# Patient Record
Sex: Female | Born: 1990 | Race: White | Hispanic: No | Smoking: Never smoker
Health system: Southern US, Community
[De-identification: ages and names within clinical notes are randomized; demographics above are authoritative.]

## PROBLEM LIST (undated history)

## (undated) DIAGNOSIS — Z789 Other specified health status: Secondary | ICD-10-CM

## (undated) DIAGNOSIS — K219 Gastro-esophageal reflux disease without esophagitis: Secondary | ICD-10-CM

## (undated) DIAGNOSIS — K529 Noninfective gastroenteritis and colitis, unspecified: Secondary | ICD-10-CM

## (undated) HISTORY — DX: Gastro-esophageal reflux disease without esophagitis: K21.9

---
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
## 2011-01-21 NOTE — Op Note (Signed)
[place]  Patient:    Nancy [name], Nancy [name] Visit Number: [id number] MRN: [id number]          Service Type: DSU Location: [facility] Attending Physician:  Aundria Mems Dictated by:   Kathy Breach, M.D. Proc. Date: 11/05/01 Admit Date:  11/05/2001                             Operative Report  PREOPERATIVE DIAGNOSIS: 1. Recurrent otitis media with effusion and conductive hearing loss, left ear. 2. Hyperplastic necrotic adenoiditis.  POSTOPERATIVE DIAGNOSIS: 1. Recurrent otitis media with effusion and conductive hearing loss, left ear. 2. Hyperplastic necrotic adenoiditis.  OPERATION PERFORMED:  Myringotomy with insertion of T-tube, left ear, examination of right ear under anesthesia, and adenoidectomy.  SURGEON:  Kathy Breach, M.D.  ANESTHESIA:  DESCRIPTION OF PROCEDURE:  Under visualization with the operating microscope, the left tympanic membrane was inspected, there was no attic retraction present.  Tympanic membrane was opaque in color with moderate tympanosclerosis involving the anterior half of the tympanic membrane and posterior superiorly. A radial anterior inferior myringotomy incision was made and a somewhat clouded thick mucoid fluid aspirated from the middle ear space.  A T-tube was inserted.  Floxin Otic drops were displaced demonstrating retrograde patent eustachian tube.  The right ear was then inspected under visualization with the operating microscope.  There was a small tympanosclerotic plaque in the posterior inferior quadrant but otherwise completely normal-appearing tympanic membrane.  A Crowe-Davis mouth gag was inserted and the patient put in a Rose position. The oral cavity examination revealed normal-appearing soft palate and intact hard palate to palpation.  The tonsils were within the fossa to 1+ enlarged bilaterally.  A red rubber catheter was passed through the left nasal chamber and used to elevate the soft palate.   Mirror visualization of the nasopharynx revealed the nasopharynx extensive adenoid tissue with heavy cloudy mucoid discharge.  Adenoids removed by curettage and under mirror visualization with suction cautery.  Complete ablation of remaining adenoid tissue especially that extending into posterior choanae and rosenmuller fossa bilaterally and obtaining complete hemostasis of the adenoidectomy site was completed.  Blood loss suction canister estimated about 30 cc.  The patient tolerated the procedure well and was taken to the recovery room in stable general condition. Dictated by:   Kathy Breach, M.D. Attending Physician:  Aundria Mems DD:  11/05/01 TD:  11/05/01 Job: [id number]

## 2011-03-01 ENCOUNTER — Inpatient Hospital Stay (HOSPITAL_COMMUNITY)
Admission: AD | Admit: 2011-03-01 | Discharge: 2011-03-05 | DRG: 766 | Disposition: A | Discharge status: Home / Self Care | Payer: Medicaid Other | Source: Ambulatory Visit | Attending: Obstetrics and Gynecology | Admitting: Obstetrics and Gynecology

## 2011-03-01 DIAGNOSIS — O324XX0 Maternal care for high head at term, not applicable or unspecified: Secondary | ICD-10-CM | POA: Diagnosis present

## 2011-03-01 DIAGNOSIS — O4290 Premature rupture of membranes, unspecified as to length of time between rupture and onset of labor, unspecified weeks of gestation: Secondary | ICD-10-CM | POA: Diagnosis present

## 2011-03-01 LAB — COMPREHENSIVE METABOLIC PANEL
Albumin: 2.8 g/dL — ABNORMAL LOW (ref 3.5–5.2)
BUN: 7 mg/dL (ref 6–23)
Calcium: 10.4 mg/dL (ref 8.4–10.5)
GFR calc Af Amer: >60 mL/min (ref >60)
Glucose, Bld: 81 mg/dL (ref 70–99)
Total Protein: 6.3 g/dL (ref 6.0–8.3)

## 2011-03-01 LAB — CBC
HCT: 29 % — ABNORMAL LOW (ref 36.0–46.0)
Hemoglobin: 9.6 g/dL — ABNORMAL LOW (ref 12.0–15.0)
MCH: 27 pg (ref 26.0–34.0)
MCHC: 33.1 g/dL (ref 30.0–36.0)
RDW: 12.6 % (ref 11.5–15.5)

## 2011-03-04 LAB — CBC
HCT: 21.8 % — ABNORMAL LOW (ref 36.0–46.0)
HCT: 22.8 % — ABNORMAL LOW (ref 36.0–46.0)
Hemoglobin: 7.2 g/dL — ABNORMAL LOW (ref 12.0–15.0)
Hemoglobin: 7.4 g/dL — ABNORMAL LOW (ref 12.0–15.0)
MCH: 26.8 pg (ref 26.0–34.0)
MCHC: 32.5 g/dL (ref 30.0–36.0)
MCV: 81 fL (ref 78.0–100.0)
MCV: 82.3 fL (ref 78.0–100.0)
RBC: 2.69 MIL/uL — ABNORMAL LOW (ref 3.87–5.11)
RDW: 13.1 % (ref 11.5–15.5)

## 2011-03-06 ENCOUNTER — Inpatient Hospital Stay (HOSPITAL_COMMUNITY): Admission: AD | Admit: 2011-03-06 | Payer: Self-pay | Source: Home / Self Care | Admitting: Obstetrics and Gynecology

## 2011-03-21 NOTE — Discharge Summary (Signed)
[name], Nancy [name] NO.:  [id number]  MEDICAL RECORD NO.:  [id number]  LOCATION:  9102                          FACILITY:  [facility]  PHYSICIAN:  Zelphia Cairo, MD    DATE OF BIRTH:  February 22, 1991  DATE OF ADMISSION:  03/01/2011 DATE OF DISCHARGE:  03/05/2011                              DISCHARGE SUMMARY   ADMITTING DIAGNOSES: 1. Intrauterine pregnancy with rupture of membranes. 2. Spontaneous onset of labor.  DISCHARGE DIAGNOSES: 1. Status post low transverse cesarean section secondary to failure to     progress. 2. Viable female infant.  PROCEDURE:  Primary low transverse cesarean section.  REASON FOR ADMISSION:  Please see written H&P.  HOSPITAL COURSE:  The patient is an 19-year primigravida who was admitted to [facility] at 22 weeks' estimated gestational age with spontaneous rupture of membranes and early onset of labor.  On admission vital signs were stable.  Fetal heart tones in the 140s-150s with nice acceleration, contraction approximately every 3-5 minutes.  Cervix was examined and found to be 3 cm dilated, 70% effaced, vertex at -1 station.  The patient did progress to full dilatation after pushing for approximately 2 hours with no further descent and the fetal vertex decision was made to proceed with a primary low transverse cesarean section.  The patient was then transferred to operating room where epidural was dosed to an adequate surgical level.  A low transverse incision was made with delivery of a viable female infant weighing 7 pounds 0 ounces with Apgar's of 8 at 1 minute and 9 at 5 minutes.  The patient tolerated the procedure well and taken to the recovery room in stable condition.  On postoperative day #1, the patient was without complaint.  Vital signs were stable.  Abdomen is soft. Fundus firm and nontender.  Laboratory findings showed hemoglobin of 7.2.  On postoperative day #2, the patient was without complaint.   Vital signs remained stable.  Abdomen soft.  Fundus firm and nontender. Incision was clean, dry, and intact.  Repeat CBC revealed hemoglobin stable at 7.4.  On postoperative day #3, the patient was ready for discharge.  Vital signs were stable.  Fundus firm and nontender. Incision was clean, dry, and intact.  Discharge instructions were reviewed and the patient was later discharged home.  CONDITION ON DISCHARGE:  Stable.  DIET:  Regular as tolerated.  ACTIVITY:  No heavy lifting, no driving x2 weeks, no vaginal entry.  FOLLOWUP:  The patient to follow up in the office in 1-2 weeks for an incision check.  She is to call for temperature greater than 100 degrees, persistent nausea, vomiting, heavy vaginal bleeding and/or redness or drainage from incisional site.  DISCHARGE MEDICATIONS: 1. Tylox #30, 1 p.o. every 4-6 hours. 2. Motrin 600 mg every 6 hours. 3. Prenatal vitamins 1 p.o. daily. 4. Colace 1 p.o. daily p.r.n.     Julio Sicks, N.P.   ______________________________ Zelphia Cairo, MD    CC/MEDQ  D:  03/21/2011  T:  03/21/2011  Job:  [id number]

## 2011-03-28 NOTE — Op Note (Signed)
NAMEMarland [name]  NAVPREET, SZCZYGIEL NO.:  [id number]  MEDICAL RECORD NO.:  [id number]  LOCATION:  9102                          FACILITY:  [facility]  PHYSICIAN:  Juluis Mire, M.D.   DATE OF BIRTH:  1991-02-22  DATE OF PROCEDURE:  03/02/2011 DATE OF DISCHARGE:                              OPERATIVE REPORT   PREOPERATIVE DIAGNOSIS:  Intrauterine pregnancy at term, failure to descend.  POSTOPERATIVE DIAGNOSIS:  Intrauterine pregnancy at term, failure to descend.  OPERATIVE PROCEDURE:  Low transverse cesarean section.  SURGEON:  Juluis Mire, MD  ANESTHESIA:  Epidural.  ESTIMATED BLOOD LOSS:  600 mL.  PACKS AND DRAINS:  None.  INTRAOPERATIVE BLOOD PLACED:  None.  COMPLICATIONS:  None.  INDICATIONS:  The patient is an 20 year old primigravida female, presents spontaneous onset of labor.  With the aid of Pitocin, progressed the complete dilatation with 2 hours of pushing.  Fetal vertex made absolutely at arrested at -1 station without descending increasing caput.  The decision was made to do a primary cesarean section.  It was noted that she did have a very narrow pelvic outlet. The risk of cesarean section were explained including the risk of infection.  The risk of hemorrhage could require transfusion with the risk of AIDS or hepatitis.  Excessive bleeding could require hysterectomy.  Risk of injury to adjacent organs including bladder, bowel, or ureters that could require further exploratory surgery.  Risk of deep venous thrombosis and pulmonary embolus.  The patient does understand indications and risks.  PROCEDURES:  The patient was taken to the OR, placed in supine position with left lateral tilt.  After satisfactory level of epidural anesthesia was obtained, the abdomen was prepped out with Betadine and draped in sterile field.  Low transverse skin incision made knife, carried through subcutaneous tissue.  She did have some discomfort on the left side  of incision.  This was injected have some Marcaine.  Incision fashioned laterally.  Fascia was taken off the muscle superiorly and inferiorly. Rectus muscles were separated midline.  Peritoneum was entered sharply and incision of peritoneum extended both superiorly inferiorly.  The low- transverse bladder flap was developed, a low-transverse uterine incision was begun with knife extending lateral using manual traction.  Infant presented in the vertex presentation.  We did have some difficulty delivering the vertex.  We had to split the muscles on each side slightly, then the vertex, delivered with elevation of the head and fundal pressure. The infant was a viable female.  Apgars were 8/9.  Weight and pH are pending.  Placenta was delivered manually and was unremarkable.  Uterus exteriorized for closure.  Tubes and ovaries were unremarkable.  Uterus was then closed with running locking suture of 0 chromic using a two-layer closure technique.  Uterus was returned to the abdominal cavity.  Urine was slightly blood tinged; when we came into the OR, it was clearing in the tube.  We irrigated the pelvis, had no active bleeding and again clear urine output.  Muscles and peritoneum were closed with running suture of 3-0 Vicryl.  Fascia was closed with a running suture of 0 PDS.  Skin was closed staples and Steri-Strips. Sponge, instrument and needle count was correct  by circulating nurse x2. The patient tolerated the procedure well, was returned to recovery room in good condition.     Juluis Mire, M.D.     JSM/MEDQ  D:  03/02/2011  T:  03/03/2011  Job:  [id number]  Electronically Signed by Richardean Chimera M.D. on 03/28/2011 06:10:25 AM

## 2011-06-03 LAB — POCT CARDIAC MARKERS: CKMB, poc: <1 — ABNORMAL LOW

## 2013-02-09 ENCOUNTER — Encounter (HOSPITAL_COMMUNITY): Payer: Self-pay | Admitting: Emergency Medicine

## 2013-02-09 ENCOUNTER — Emergency Department (HOSPITAL_COMMUNITY)
Admission: EM | Admit: 2013-02-09 | Discharge: 2013-02-09 | Disposition: A | Discharge status: Home / Self Care | Payer: Self-pay | Attending: Emergency Medicine | Admitting: Emergency Medicine

## 2013-02-09 ENCOUNTER — Emergency Department (HOSPITAL_COMMUNITY): Payer: Self-pay

## 2013-02-09 DIAGNOSIS — R109 Unspecified abdominal pain: Secondary | ICD-10-CM | POA: Insufficient documentation

## 2013-02-09 DIAGNOSIS — R112 Nausea with vomiting, unspecified: Secondary | ICD-10-CM | POA: Insufficient documentation

## 2013-02-09 DIAGNOSIS — N898 Other specified noninflammatory disorders of vagina: Secondary | ICD-10-CM | POA: Insufficient documentation

## 2013-02-09 DIAGNOSIS — Z3202 Encounter for pregnancy test, result negative: Secondary | ICD-10-CM | POA: Insufficient documentation

## 2013-02-09 DIAGNOSIS — R350 Frequency of micturition: Secondary | ICD-10-CM | POA: Insufficient documentation

## 2013-02-09 DIAGNOSIS — N12 Tubulo-interstitial nephritis, not specified as acute or chronic: Secondary | ICD-10-CM | POA: Insufficient documentation

## 2013-02-09 DIAGNOSIS — R000 Tachycardia, unspecified: Secondary | ICD-10-CM | POA: Insufficient documentation

## 2013-02-09 DIAGNOSIS — R300 Dysuria: Secondary | ICD-10-CM | POA: Insufficient documentation

## 2013-02-09 LAB — CBC
HCT: 33.9 % — ABNORMAL LOW (ref 36.0–46.0)
Hemoglobin: 11.5 g/dL — ABNORMAL LOW (ref 12.0–15.0)
MCH: 29.2 pg (ref 26.0–34.0)
MCHC: 33.9 g/dL (ref 30.0–36.0)
MCV: 86 fL (ref 78.0–100.0)
Platelets: 149 10*3/uL — ABNORMAL LOW (ref 150–400)
RBC: 3.94 MIL/uL (ref 3.87–5.11)
RDW: 12.2 % (ref 11.5–15.5)
WBC: 15.2 10*3/uL — ABNORMAL HIGH (ref 4.0–10.5)

## 2013-02-09 LAB — URINALYSIS, ROUTINE W REFLEX MICROSCOPIC
Glucose, UA: NEGATIVE mg/dL
Specific Gravity, Urine: 1.017 (ref 1.005–1.030)
Urobilinogen, UA: 1 mg/dL (ref 0.0–1.0)

## 2013-02-09 LAB — BASIC METABOLIC PANEL
BUN: 8 mg/dL (ref 6–23)
CO2: 24 mEq/L (ref 19–32)
Calcium: 8.9 mg/dL (ref 8.4–10.5)
Chloride: 103 mEq/L (ref 96–112)
Creatinine, Ser: 0.84 mg/dL (ref 0.50–1.10)
GFR calc Af Amer: >90 mL/min (ref >90)
GFR calc non Af Amer: >90 mL/min (ref >90)
Glucose, Bld: 110 mg/dL — ABNORMAL HIGH (ref 70–99)
Potassium: 3.6 mEq/L (ref 3.5–5.1)
Sodium: 137 mEq/L (ref 135–145)

## 2013-02-09 LAB — WET PREP, GENITAL
Clue Cells Wet Prep HPF POC: NONE SEEN
Trich, Wet Prep: NONE SEEN
Yeast Wet Prep HPF POC: NONE SEEN

## 2013-02-09 LAB — URINE MICROSCOPIC-ADD ON

## 2013-02-09 LAB — PREGNANCY, URINE: Preg Test, Ur: NEGATIVE

## 2013-02-09 MED ORDER — ONDANSETRON HCL 4 MG/2ML IJ SOLN
4.0000 mg | Freq: Once | INTRAMUSCULAR | Status: AC
  Administered 2013-02-09: 4 mg via INTRAVENOUS
  Filled 2013-02-09: qty 2

## 2013-02-09 MED ORDER — MORPHINE SULFATE 4 MG/ML IJ SOLN
4.0000 mg | Freq: Once | INTRAMUSCULAR | Status: AC
  Administered 2013-02-09: 4 mg via INTRAVENOUS
  Filled 2013-02-09: qty 1

## 2013-02-09 MED ORDER — IOHEXOL 300 MG/ML  SOLN
80.0000 mL | Freq: Once | INTRAMUSCULAR | Status: AC | PRN
  Administered 2013-02-09: 80 mL via INTRAVENOUS

## 2013-02-09 MED ORDER — CIPROFLOXACIN HCL 500 MG PO TABS: 500.0000 mg | ORAL_TABLET | Freq: Two times a day (BID) | ORAL | Status: DC

## 2013-02-09 MED ORDER — SODIUM CHLORIDE 0.9 % IV BOLUS (SEPSIS)
1000.0000 mL | Freq: Once | INTRAVENOUS | Status: AC
  Administered 2013-02-09: 1000 mL via INTRAVENOUS

## 2013-02-09 MED ORDER — ONDANSETRON 4 MG PO TBDP: 4.0000 mg | ORAL_TABLET | Freq: Three times a day (TID) | ORAL | Status: DC | PRN

## 2013-02-09 MED ORDER — ACETAMINOPHEN 500 MG PO TABS
1000.0000 mg | ORAL_TABLET | Freq: Once | ORAL | Status: AC
  Administered 2013-02-09: 1000 mg via ORAL
  Filled 2013-02-09: qty 2

## 2013-02-09 MED ORDER — IOHEXOL 300 MG/ML  SOLN
50.0000 mL | Freq: Once | INTRAMUSCULAR | Status: AC | PRN
  Administered 2013-02-09: 50 mL via ORAL

## 2013-02-09 MED ORDER — HYDROCODONE-ACETAMINOPHEN 5-325 MG PO TABS: ORAL_TABLET | ORAL | Status: DC

## 2013-02-09 MED ORDER — DICYCLOMINE HCL 10 MG PO CAPS: 10.0000 mg | ORAL_CAPSULE | Freq: Once | ORAL | Status: DC

## 2013-02-09 MED ORDER — DEXTROSE 5 % IV SOLN
1.0000 g | INTRAVENOUS | Status: DC
  Administered 2013-02-09: 1 g via INTRAVENOUS
  Filled 2013-02-09: qty 10

## 2013-02-09 NOTE — ED Notes (Signed)
Patient in CT at this time.

## 2013-02-09 NOTE — ED Provider Notes (Signed)
History     CSN: [id number]  Arrival date & time 02/09/13  4098   First MD Initiated Contact with Patient 02/09/13 1115      Chief Complaint  Patient presents with  . Back Pain    (Consider location/radiation/quality/duration/timing/severity/associated sxs/prior treatment) HPI Pt is a 22yo female c/o acute onset of lower abdominal pain that is sharp and cramping, radiating to lower back, 10/10 that started last night.  Associated with nausea and 3 episodes of NBNB emesis this morning.  Pt also c/o mild dysuria and vaginal discharged that is white/yellow in color w/o blood.  States LMP was 02/07/13.  Pt has tried ibuprofen for pain with minimal relief.  Denies fever diarrhea or constipation.  Reports c-section 2 years ago w/o complication.  Denies recent travel or sick contacts.   History reviewed. No pertinent past medical history.  History reviewed. No pertinent past surgical history.  History reviewed. No pertinent family history.  History  Substance Use Topics  . Smoking status: Not on file  . Smokeless tobacco: Not on file  . Alcohol Use: Not on file    OB History   Grav Para Term Preterm Abortions TAB SAB Ect Mult Living                  Review of Systems  Constitutional: Negative for fever and chills.  Respiratory: Negative for shortness of breath.   Cardiovascular: Negative for chest pain.  Gastrointestinal: Positive for nausea and vomiting. Negative for diarrhea, constipation and blood in stool.  Genitourinary: Positive for dysuria, frequency and vaginal discharge. Negative for hematuria, flank pain and vaginal pain.  All other systems reviewed and are negative.    Allergies  Review of patient's allergies indicates no known allergies.  Home Medications   Current Outpatient Rx  Name  Route  Sig  Dispense  Refill  . ibuprofen (ADVIL,MOTRIN) 200 MG tablet   Oral   Take 600 mg by mouth once.           BP 112/61  Pulse 105  Temp(Src) 100 F (37.8 C)  (Oral)  Resp 16  Ht 5\' 1"  (1.549 m)  Wt 115 lb (52.164 kg)  BMI 21.74 kg/m2  SpO2 100%  LMP 02/07/2013  Physical Exam  Nursing note and vitals reviewed. Constitutional: She appears well-developed and well-nourished. No distress.  Pt lying on exam bed, appears in mild discomfort  HENT:  Head: Normocephalic and atraumatic.  Eyes: Conjunctivae are normal. No scleral icterus.  Neck: Normal range of motion.  Cardiovascular: Regular rhythm and normal heart sounds.  Tachycardia present.   Pulmonary/Chest: Effort normal and breath sounds normal. No respiratory distress. She has no wheezes. She has no rales. She exhibits no tenderness.  Abdominal: Soft. Bowel sounds are normal. She exhibits no distension and no mass. There is tenderness. There is CVA tenderness ( L>R). There is no rebound and no guarding. Hernia confirmed negative in the right inguinal area and confirmed negative in the left inguinal area.  Genitourinary: Uterus normal. No labial fusion. There is no rash, tenderness, lesion or injury on the right labia. There is no rash, tenderness, lesion or injury on the left labia. Cervix exhibits discharge ( yellow clear discharge and blood). Cervix exhibits no motion tenderness and no friability. Right adnexum displays no mass, no tenderness and no fullness. Left adnexum displays no mass, no tenderness and no fullness. There is bleeding ( moderate amount) around the vagina. No erythema or tenderness around the vagina. No foreign body  around the vagina. No signs of injury around the vagina. Vaginal discharge ( scant yellow discharge) found.  Musculoskeletal: Normal range of motion. She exhibits tenderness ( across lower lumbar spine).  Lymphadenopathy:       Right: No inguinal adenopathy present.       Left: No inguinal adenopathy present.  Neurological: She is alert.  Skin: Skin is warm and dry. She is not diaphoretic.    ED Course  Procedures (including critical care time)  Labs Reviewed   WET PREP, GENITAL - Abnormal; Notable for the following:    WBC, Wet Prep HPF POC FEW (*)    All other components within normal limits  URINALYSIS, ROUTINE W REFLEX MICROSCOPIC - Abnormal; Notable for the following:    APPearance CLOUDY (*)    Hgb urine dipstick LARGE (*)    Protein, ur 30 (*)    Leukocytes, UA SMALL (*)    All other components within normal limits  CBC - Abnormal; Notable for the following:    WBC 15.2 (*)    Hemoglobin 11.5 (*)    HCT 33.9 (*)    Platelets 149 (*)    All other components within normal limits  BASIC METABOLIC PANEL - Abnormal; Notable for the following:    Glucose, Bld 110 (*)    All other components within normal limits  GC/CHLAMYDIA PROBE AMP  URINE CULTURE  PREGNANCY, URINE  URINE MICROSCOPIC-ADD ON   Ct Abdomen Pelvis W Contrast  02/09/2013   *RADIOLOGY REPORT*  Clinical Data: Lower abdominal and back pain.  Nausea and vomiting.  CT ABDOMEN AND PELVIS WITH CONTRAST  Technique:  Multidetector CT imaging of the abdomen and pelvis was performed following the standard protocol during bolus administration of intravenous contrast.  Contrast: 80mL OMNIPAQUE IOHEXOL 300 MG/ML  SOLN  Comparison: No priors.  Findings:  Lung Bases: Unremarkable.  Abdomen/Pelvis:  The appearance of the liver, gallbladder, pancreas, spleen, bilateral adrenal glands tip in the right kidney is unremarkable.  There are some ill-defined areas of decreased perfusion in the left kidney, suspicious for early changes related to pyelonephritis.  A small volume of free fluid the cul-de-sac is presumably physiologic in this young female patient.  No larger volume of ascites.  No pneumoperitoneum.  No pathologic distension of small bowel.  Normal appendix.  No definite pathologic lymphadenopathy identified within the abdomen or pelvis.  A tampon is present within the vagina.  Uterus and ovaries are unremarkable in appearance.  Urinary bladder is normal in appearance.  Musculoskeletal: There are  no aggressive appearing lytic or blastic lesions noted in the visualized portions of the skeleton.  IMPRESSION: 1.  Areas of decreased perfusion in the left kidney highly suspicious for early changes related to pyelonephritis.  Clinical correlation is recommended. 2.  Small volume of free fluid the cul-de-sac is presumably physiologic in this young female patient. 3.  Normal appendix. 4.  Additional incidental findings, as above.   Original Report Authenticated By: Trudie Reed, M.D.     1. Pyelonephritis   2. Nausea & vomiting       MDM  Concern for UTI/pyelonephrosis, ectopic, vaginal infection.  Pt is tachycardic with pain 10/10.  Will obtain basic labs and urine. Started pt on fluids and morphine for pain with zofran  CBC: WBC-15.2 BMP: unremarkable  UA: mild leukocytosis, RBC 7-10, bacteria rare.  Urine not very impressive to suggest pylonephrosis.  Will culture urine Urine preg: neg  Due to tachycardia, pain, and unimpressive UA, will obtain  CT abd.  CT abd: shows areas of decreased perfusion in left kidney, highly suspicious for early changes related to pyelonephritis.  Although pt's UA was not very impressive, ordered 1g Rocephin to start tx for pyelo. Based on pt's symptoms and CT.  Pt is still tachycardic, gave more morphine and fluids.  If vitals improve, will discharge pt home with out patient treatment, otherwise, consider admitting pt for IV tx and observation.  6:12 PMPt developed low grade temp at 100.  Vitals have improved, but still at little tachycardic at 105, pressure 112/61.  Will give pt acetaminophine and another bag of fluid.  Discussed option of being admitted for pain control or discharged home for outpatient tx.  Pt states she feels comfortable going home.  Will discharge pt home after fluids.  Rx: ciprofloxacin, norco and zofran.  Will have pt f/u with PCP, urgent care, or return to ER if symptoms not improving/worsening.  Pt verbalized understanding and  agreement with tx plan. Vitals: unremarkable. Discharged home in stable condition.    Discussed pt with attending during ED encounter.                Junius Finner, PA-C 02/09/13 1856

## 2013-02-09 NOTE — ED Notes (Signed)
Waiting for fluid bolus to finish and to recheck heart rate once done.

## 2013-02-09 NOTE — ED Notes (Signed)
Patient presents to ED today with complaints of pain with urination low back pain, low abdominal pain since last night.

## 2013-02-10 LAB — GC/CHLAMYDIA PROBE AMP
CT Probe RNA: NEGATIVE
GC Probe RNA: NEGATIVE

## 2013-02-11 NOTE — ED Provider Notes (Signed)
Medical screening examination/treatment/procedure(s) were conducted as a shared visit with non-physician practitioner(s) and myself.  I personally evaluated the patient during the encounter Pt c/o lower abd discomfort for past day, esp left. No dysuria. 7-10 wbc. Persistent tenderness. Rocephin iv. Cx. Persistent pain, ct.   Suzi Roots, MD 02/11/13 [phone]

## 2013-02-12 LAB — URINE CULTURE: Colony Count: 40000

## 2013-02-13 NOTE — ED Notes (Signed)
Post ED Visit - Positive Culture Follow-up  Culture report reviewed by antimicrobial stewardship pharmacist: [x]  Wes Dulaney, Pharm.D., BCPS []  Celedonio Miyamoto, Pharm.D., BCPS []  Georgina Pillion, Pharm.D., BCPS []  [place], [place].D., BCPS, AAHIVP []  Estella Husk, Pharm.D., BCPS, AAHIVP  Positive urine culture  no further patient follow-up is required at this time.  Larena Sox 02/13/2013, 3:38 PM

## 2013-04-29 ENCOUNTER — Emergency Department (HOSPITAL_COMMUNITY): Payer: Medicaid Other

## 2013-04-29 ENCOUNTER — Emergency Department (HOSPITAL_COMMUNITY)
Admission: EM | Admit: 2013-04-29 | Discharge: 2013-04-29 | Disposition: A | Discharge status: Home / Self Care | Payer: Medicaid Other | Attending: Emergency Medicine | Admitting: Emergency Medicine

## 2013-04-29 ENCOUNTER — Encounter (HOSPITAL_COMMUNITY): Payer: Self-pay | Admitting: Emergency Medicine

## 2013-04-29 DIAGNOSIS — O30049 Twin pregnancy, dichorionic/diamniotic, unspecified trimester: Secondary | ICD-10-CM

## 2013-04-29 DIAGNOSIS — R109 Unspecified abdominal pain: Secondary | ICD-10-CM | POA: Insufficient documentation

## 2013-04-29 DIAGNOSIS — O9989 Other specified diseases and conditions complicating pregnancy, childbirth and the puerperium: Secondary | ICD-10-CM | POA: Insufficient documentation

## 2013-04-29 LAB — POCT I-STAT, CHEM 8
BUN: 5 mg/dL — ABNORMAL LOW (ref 6–23)
Creatinine, Ser: 0.7 mg/dL (ref 0.50–1.10)
Hemoglobin: 12.2 g/dL (ref 12.0–15.0)
Potassium: 3.6 mEq/L (ref 3.5–5.1)
Sodium: 139 mEq/L (ref 135–145)

## 2013-04-29 LAB — CBC WITH DIFFERENTIAL/PLATELET
Basophils Absolute: 0 10*3/uL (ref 0.0–0.1)
Eosinophils Absolute: 0.1 10*3/uL (ref 0.0–0.7)
Eosinophils Relative: 1 % (ref 0–5)
MCH: 29.6 pg (ref 26.0–34.0)
MCHC: 34.6 g/dL (ref 30.0–36.0)
MCV: 85.6 fL (ref 78.0–100.0)
Monocytes Absolute: 0.8 10*3/uL (ref 0.1–1.0)
Platelets: 167 10*3/uL (ref 150–400)
RDW: 12.4 % (ref 11.5–15.5)

## 2013-04-29 LAB — HCG, QUANTITATIVE, PREGNANCY: hCG, Beta Chain, Quant, S: 38750 m[IU]/mL — ABNORMAL HIGH (ref <5)

## 2013-04-29 MED ORDER — SODIUM CHLORIDE 0.9 % IV BOLUS (SEPSIS)
1000.0000 mL | Freq: Once | INTRAVENOUS | Status: AC
  Administered 2013-04-29: 1000 mL via INTRAVENOUS

## 2013-04-29 NOTE — Progress Notes (Signed)
[facility] provided patient with a list of primary care resources, highlighting the [facility].

## 2013-04-29 NOTE — ED Provider Notes (Signed)
  This was a shared visit with a mid-level provided (NP or PA).  Throughout the patient's course I was available for consultation/collaboration.  I saw the relevant labs and studies - I agree with the interpretation.  On my exam the patient was in no distress.  She was resting comfortably, with a child on her gurney.  With her elevated hCG, pain, bleeding, no prior evaluations pregnancy, ultrasound was ordered.  This was pending on sign out      Gerhard Munch, MD 04/29/13 [phone]

## 2013-04-29 NOTE — ED Notes (Signed)
Patient transported to Ultrasound 

## 2013-04-29 NOTE — ED Notes (Signed)
Pt arrived to ED via POV with a complaint of vaginal bleeding.  Pt is [redacted] weeks pregnant. Pt states she had spotting yesterday that progressed to a large amount in the toilet.  Pt is wearing pads and states she has used one over the last several hours.

## 2013-04-29 NOTE — ED Provider Notes (Signed)
CSN: [id number]     Arrival date & time 04/29/13  0403 History     First MD Initiated Contact with Patient 04/29/13 [phone]     Chief Complaint  Patient presents with  . Vaginal Bleeding   (Consider location/radiation/quality/duration/timing/severity/associated sxs/prior Treatment) HPI Comments: Patient has had a confirmed pregnancy at the health department.  She is 10, and a half weeks pregnant, yesterday.  She noticed some intermittent spotting x2.  This morning.  She woke up to use the restroom and noticed some bright red blood on the tissue while wiping.  She has some abdominal pressure, but no discrete pain.  This is a desired pregnancy.  Patient is a 22 y.o. female presenting with vaginal bleeding. The history is provided by the patient.  Vaginal Bleeding Quality:  Lighter than menses and spotting Severity:  Mild Onset quality:  Sudden Duration:  2 days Timing:  Intermittent Chronicity:  New Number of pads used:  1 Number of tampons used:  0 Possible pregnancy: yes   Relieved by:  None tried Associated symptoms: abdominal pain   Associated symptoms: no back pain, no dysuria, no fever, no nausea and no vaginal discharge   Risk factors: no hx of ectopic pregnancy, no hx of endometriosis, no gynecological surgery, no prior miscarriage, no STD and no terminated pregnancies     History reviewed. No pertinent past medical history. Past Surgical History  Procedure Laterality Date  . Cesarean section     No family history on file. History  Substance Use Topics  . Smoking status: Never Smoker   . Smokeless tobacco: Not on file  . Alcohol Use: No   OB History   Grav Para Term Preterm Abortions TAB SAB Ect Mult Living   1              Review of Systems  Constitutional: Negative for fever and chills.  Gastrointestinal: Positive for abdominal pain. Negative for nausea and vomiting.  Genitourinary: Positive for vaginal bleeding. Negative for dysuria, frequency, vaginal  discharge and vaginal pain.  Musculoskeletal: Negative for back pain.  All other systems reviewed and are negative.    Allergies  Review of patient's allergies indicates no known allergies.  Home Medications   Current Outpatient Rx  Name  Route  Sig  Dispense  Refill  . ibuprofen (ADVIL,MOTRIN) 200 MG tablet   Oral   Take 600 mg by mouth once.          BP 108/54  Pulse 63  Temp(Src) 98.4 F (36.9 C) (Oral)  Resp 16  Ht 5\' 1"  (1.549 m)  Wt 115 lb (52.164 kg)  BMI 21.74 kg/m2  SpO2 99%  LMP 02/07/2013 Physical Exam  Nursing note and vitals reviewed. Constitutional: She is oriented to person, place, and time. She appears well-developed and well-nourished.  HENT:  Head: Normocephalic.  Eyes: Pupils are equal, round, and reactive to light.  Cardiovascular: Normal rate and regular rhythm.   Pulmonary/Chest: Effort normal and breath sounds normal.  Abdominal: Soft. There is no tenderness.  Genitourinary: Vagina normal. Cervix exhibits no discharge. Right adnexum displays no tenderness. Left adnexum displays no tenderness. No vaginal discharge found.  Cervix is closed.  There is old brownish blood within the vaginal vault  Neurological: She is alert and oriented to person, place, and time.  Skin: Skin is warm and dry.    ED Course   Procedures (including critical care time)  Labs Reviewed  WET PREP, GENITAL  GC/CHLAMYDIA PROBE AMP  CBC WITH  DIFFERENTIAL  HCG, QUANTITATIVE, PREGNANCY   No results found. No diagnosis found.  MDM     Arman Filter, NP 04/29/13 [phone]

## 2013-04-29 NOTE — Progress Notes (Signed)
[facility] ED CM spoke with pt during ED visit for 04/29/13 and pt confirmed no pcp [facility] ED CM spoke with pt on how to obtain an in network pcp with insurance coverage via the customer service number or web site Pt states ob gyn is [facility] updated   CM encouraged pt and discussed pt's responsibility to verify with pt's insurance carrier that any recommended medical provider offered by any emergency room or a hospital provider is within the carrier's network. The pt voiced understanding

## 2013-04-30 LAB — GC/CHLAMYDIA PROBE AMP: GC Probe RNA: NEGATIVE

## 2013-09-30 ENCOUNTER — Other Ambulatory Visit: Payer: Self-pay

## 2013-10-14 ENCOUNTER — Other Ambulatory Visit: Payer: Self-pay

## 2013-10-15 ENCOUNTER — Other Ambulatory Visit (HOSPITAL_COMMUNITY): Payer: Self-pay | Admitting: Obstetrics and Gynecology

## 2013-10-15 DIAGNOSIS — O30009 Twin pregnancy, unspecified number of placenta and unspecified number of amniotic sacs, unspecified trimester: Secondary | ICD-10-CM

## 2013-10-24 ENCOUNTER — Encounter (HOSPITAL_COMMUNITY): Payer: Self-pay

## 2013-10-24 ENCOUNTER — Ambulatory Visit (HOSPITAL_COMMUNITY)
Admission: RE | Admit: 2013-10-24 | Discharge: 2013-10-24 | Disposition: A | Discharge status: Home / Self Care | Payer: Medicaid Other | Source: Ambulatory Visit | Attending: Obstetrics and Gynecology | Admitting: Obstetrics and Gynecology

## 2013-10-24 DIAGNOSIS — O34219 Maternal care for unspecified type scar from previous cesarean delivery: Secondary | ICD-10-CM | POA: Diagnosis not present

## 2013-10-24 DIAGNOSIS — Z1389 Encounter for screening for other disorder: Secondary | ICD-10-CM | POA: Diagnosis not present

## 2013-10-24 DIAGNOSIS — Z363 Encounter for antenatal screening for malformations: Secondary | ICD-10-CM | POA: Insufficient documentation

## 2013-10-24 DIAGNOSIS — O30009 Twin pregnancy, unspecified number of placenta and unspecified number of amniotic sacs, unspecified trimester: Secondary | ICD-10-CM | POA: Insufficient documentation

## 2013-10-24 DIAGNOSIS — O358XX0 Maternal care for other (suspected) fetal abnormality and damage, not applicable or unspecified: Secondary | ICD-10-CM | POA: Diagnosis not present

## 2013-10-28 ENCOUNTER — Ambulatory Visit (INDEPENDENT_AMBULATORY_CARE_PROVIDER_SITE_OTHER): Payer: Medicaid Other | Admitting: *Deleted

## 2013-10-28 VITALS — BP 122/74

## 2013-10-28 DIAGNOSIS — O30009 Twin pregnancy, unspecified number of placenta and unspecified number of amniotic sacs, unspecified trimester: Secondary | ICD-10-CM

## 2013-10-28 NOTE — Progress Notes (Signed)
P = 79   Copy of report and NST tracing sent to Dr. Henderson Cloudomblin w/pt today

## 2013-11-05 ENCOUNTER — Ambulatory Visit (INDEPENDENT_AMBULATORY_CARE_PROVIDER_SITE_OTHER): Payer: Medicaid Other | Admitting: *Deleted

## 2013-11-05 VITALS — BP 127/79

## 2013-11-05 DIAGNOSIS — O30009 Twin pregnancy, unspecified number of placenta and unspecified number of amniotic sacs, unspecified trimester: Secondary | ICD-10-CM

## 2013-11-05 NOTE — Progress Notes (Signed)
P = 94  Copy of report and NST tracing sent to Dr. Henderson Cloudomblin w/pt today.

## 2013-11-07 ENCOUNTER — Other Ambulatory Visit (HOSPITAL_COMMUNITY): Payer: Self-pay | Admitting: Obstetrics and Gynecology

## 2013-11-07 DIAGNOSIS — IMO0001 Reserved for inherently not codable concepts without codable children: Secondary | ICD-10-CM

## 2013-11-11 ENCOUNTER — Encounter (HOSPITAL_COMMUNITY): Payer: Self-pay | Admitting: *Deleted

## 2013-11-11 ENCOUNTER — Other Ambulatory Visit (HOSPITAL_COMMUNITY): Payer: Self-pay | Admitting: Obstetrics and Gynecology

## 2013-11-11 ENCOUNTER — Inpatient Hospital Stay (HOSPITAL_COMMUNITY)
Admission: AD | Admit: 2013-11-11 | Discharge: 2013-11-15 | DRG: 765 | Disposition: A | Discharge status: Home / Self Care | Payer: Medicaid Other | Source: Ambulatory Visit | Attending: Obstetrics & Gynecology | Admitting: Obstetrics & Gynecology

## 2013-11-11 ENCOUNTER — Ambulatory Visit (HOSPITAL_COMMUNITY)
Admission: RE | Admit: 2013-11-11 | Discharge: 2013-11-11 | Disposition: A | Discharge status: Home / Self Care | Payer: Medicaid Other | Source: Ambulatory Visit | Attending: Obstetrics and Gynecology | Admitting: Obstetrics and Gynecology

## 2013-11-11 ENCOUNTER — Inpatient Hospital Stay (HOSPITAL_COMMUNITY): Payer: Medicaid Other | Admitting: Anesthesiology

## 2013-11-11 ENCOUNTER — Encounter (HOSPITAL_COMMUNITY)
Admission: AD | Disposition: A | Discharge status: Home / Self Care | Payer: Self-pay | Source: Ambulatory Visit | Attending: Obstetrics & Gynecology

## 2013-11-11 ENCOUNTER — Encounter (HOSPITAL_COMMUNITY): Payer: Medicaid Other | Admitting: Anesthesiology

## 2013-11-11 DIAGNOSIS — IMO0001 Reserved for inherently not codable concepts without codable children: Secondary | ICD-10-CM

## 2013-11-11 DIAGNOSIS — O30049 Twin pregnancy, dichorionic/diamniotic, unspecified trimester: Secondary | ICD-10-CM

## 2013-11-11 DIAGNOSIS — O365990 Maternal care for other known or suspected poor fetal growth, unspecified trimester, not applicable or unspecified: Secondary | ICD-10-CM | POA: Diagnosis present

## 2013-11-11 DIAGNOSIS — Z302 Encounter for sterilization: Secondary | ICD-10-CM

## 2013-11-11 DIAGNOSIS — O9902 Anemia complicating childbirth: Secondary | ICD-10-CM | POA: Diagnosis present

## 2013-11-11 DIAGNOSIS — O34219 Maternal care for unspecified type scar from previous cesarean delivery: Principal | ICD-10-CM | POA: Diagnosis present

## 2013-11-11 DIAGNOSIS — Z3490 Encounter for supervision of normal pregnancy, unspecified, unspecified trimester: Secondary | ICD-10-CM

## 2013-11-11 DIAGNOSIS — O30009 Twin pregnancy, unspecified number of placenta and unspecified number of amniotic sacs, unspecified trimester: Secondary | ICD-10-CM | POA: Diagnosis present

## 2013-11-11 DIAGNOSIS — O360990 Maternal care for other rhesus isoimmunization, unspecified trimester, not applicable or unspecified: Secondary | ICD-10-CM | POA: Diagnosis present

## 2013-11-11 DIAGNOSIS — D649 Anemia, unspecified: Secondary | ICD-10-CM | POA: Diagnosis present

## 2013-11-11 HISTORY — DX: Other specified health status: Z78.9

## 2013-11-11 LAB — CBC
HEMATOCRIT: 30.3 % — AB (ref 36.0–46.0)
HEMOGLOBIN: 10.1 g/dL — AB (ref 12.0–15.0)
MCH: 27.1 pg (ref 26.0–34.0)
MCHC: 33.3 g/dL (ref 30.0–36.0)
MCV: 81.2 fL (ref 78.0–100.0)
PLATELETS: 145 10*3/uL — AB (ref 150–400)
RBC: 3.73 MIL/uL — AB (ref 3.87–5.11)
RDW: 13.1 % (ref 11.5–15.5)
WBC: 9.1 10*3/uL (ref 4.0–10.5)

## 2013-11-11 LAB — RPR: RPR Ser Ql: NONREACTIVE

## 2013-11-11 SURGERY — Surgical Case
Anesthesia: Spinal | Site: Abdomen

## 2013-11-11 MED ORDER — MORPHINE SULFATE 0.5 MG/ML IJ SOLN
INTRAMUSCULAR | Status: AC
  Filled 2013-11-11: qty 10

## 2013-11-11 MED ORDER — FENTANYL CITRATE 0.05 MG/ML IJ SOLN
INTRAMUSCULAR | Status: AC
  Filled 2013-11-11: qty 2

## 2013-11-11 MED ORDER — LACTATED RINGERS IV SOLN: INTRAVENOUS | Status: DC

## 2013-11-11 MED ORDER — IBUPROFEN 600 MG PO TABS
600.0000 mg | ORAL_TABLET | Freq: Four times a day (QID) | ORAL | Status: DC
  Administered 2013-11-11 – 2013-11-15 (×15): 600 mg via ORAL
  Filled 2013-11-11 (×15): qty 1

## 2013-11-11 MED ORDER — PROMETHAZINE HCL 25 MG/ML IJ SOLN: 6.2500 mg | INTRAMUSCULAR | Status: DC | PRN

## 2013-11-11 MED ORDER — LACTATED RINGERS IV BOLUS (SEPSIS)
1000.0000 mL | Freq: Once | INTRAVENOUS | Status: AC
  Administered 2013-11-11: 1000 mL via INTRAVENOUS

## 2013-11-11 MED ORDER — OXYTOCIN 40 UNITS IN LACTATED RINGERS INFUSION - SIMPLE MED: 62.5000 mL/h | INTRAVENOUS | Status: AC

## 2013-11-11 MED ORDER — PHENYLEPHRINE 8 MG IN D5W 100 ML (0.08MG/ML) PREMIX OPTIME
INJECTION | INTRAVENOUS | Status: AC
  Filled 2013-11-11: qty 100

## 2013-11-11 MED ORDER — NALBUPHINE HCL 10 MG/ML IJ SOLN: 5.0000 mg | INTRAMUSCULAR | Status: DC | PRN

## 2013-11-11 MED ORDER — MORPHINE SULFATE (PF) 0.5 MG/ML IJ SOLN
INTRAMUSCULAR | Status: DC | PRN
  Administered 2013-11-11: .1 mg via INTRATHECAL

## 2013-11-11 MED ORDER — FENTANYL CITRATE 0.05 MG/ML IJ SOLN
INTRAMUSCULAR | Status: DC | PRN
  Administered 2013-11-11: 15 ug via INTRATHECAL
  Administered 2013-11-11: 35 ug via INTRAVENOUS

## 2013-11-11 MED ORDER — 0.9 % SODIUM CHLORIDE (POUR BTL) OPTIME
TOPICAL | Status: DC | PRN
  Administered 2013-11-11: 1000 mL

## 2013-11-11 MED ORDER — MENTHOL 3 MG MT LOZG: 1.0000 | LOZENGE | OROMUCOSAL | Status: DC | PRN

## 2013-11-11 MED ORDER — ONDANSETRON HCL 4 MG/2ML IJ SOLN: 4.0000 mg | INTRAMUSCULAR | Status: DC | PRN

## 2013-11-11 MED ORDER — LANOLIN HYDROUS EX OINT: 1.0000 "application " | TOPICAL_OINTMENT | CUTANEOUS | Status: DC | PRN

## 2013-11-11 MED ORDER — DIPHENHYDRAMINE HCL 50 MG/ML IJ SOLN: 25.0000 mg | INTRAMUSCULAR | Status: DC | PRN

## 2013-11-11 MED ORDER — DIPHENHYDRAMINE HCL 50 MG/ML IJ SOLN: 12.5000 mg | INTRAMUSCULAR | Status: DC | PRN

## 2013-11-11 MED ORDER — NALOXONE HCL 0.4 MG/ML IJ SOLN: 0.4000 mg | INTRAMUSCULAR | Status: DC | PRN

## 2013-11-11 MED ORDER — PHENYLEPHRINE 40 MCG/ML (10ML) SYRINGE FOR IV PUSH (FOR BLOOD PRESSURE SUPPORT)
PREFILLED_SYRINGE | INTRAVENOUS | Status: AC
  Filled 2013-11-11: qty 5

## 2013-11-11 MED ORDER — OXYTOCIN 40 UNITS IN LACTATED RINGERS INFUSION - SIMPLE MED
INTRAVENOUS | Status: DC | PRN
  Administered 2013-11-11: 40 [IU] via INTRAVENOUS

## 2013-11-11 MED ORDER — BUPIVACAINE IN DEXTROSE 0.75-8.25 % IT SOLN
INTRATHECAL | Status: DC | PRN
  Administered 2013-11-11: 1.3 mL via INTRATHECAL

## 2013-11-11 MED ORDER — CITRIC ACID-SODIUM CITRATE 334-500 MG/5ML PO SOLN
30.0000 mL | Freq: Once | ORAL | Status: AC
  Administered 2013-11-11: 30 mL via ORAL
  Filled 2013-11-11: qty 15

## 2013-11-11 MED ORDER — MEPERIDINE HCL 25 MG/ML IJ SOLN: 6.2500 mg | INTRAMUSCULAR | Status: DC | PRN

## 2013-11-11 MED ORDER — ONDANSETRON HCL 4 MG/2ML IJ SOLN
INTRAMUSCULAR | Status: AC
  Filled 2013-11-11: qty 2

## 2013-11-11 MED ORDER — MEPERIDINE HCL 25 MG/ML IJ SOLN
6.2500 mg | INTRAMUSCULAR | Status: DC | PRN
  Administered 2013-11-11: 6.25 mg via INTRAVENOUS

## 2013-11-11 MED ORDER — KETOROLAC TROMETHAMINE 30 MG/ML IJ SOLN: 30.0000 mg | Freq: Four times a day (QID) | INTRAMUSCULAR | Status: AC | PRN

## 2013-11-11 MED ORDER — OXYTOCIN 10 UNIT/ML IJ SOLN
INTRAMUSCULAR | Status: AC
  Filled 2013-11-11: qty 4

## 2013-11-11 MED ORDER — SCOPOLAMINE 1 MG/3DAYS TD PT72
MEDICATED_PATCH | TRANSDERMAL | Status: AC
  Administered 2013-11-11: 1.5 mg via TRANSDERMAL
  Filled 2013-11-11: qty 1

## 2013-11-11 MED ORDER — ZOLPIDEM TARTRATE 5 MG PO TABS: 5.0000 mg | ORAL_TABLET | Freq: Every evening | ORAL | Status: DC | PRN

## 2013-11-11 MED ORDER — MEPERIDINE HCL 25 MG/ML IJ SOLN
INTRAMUSCULAR | Status: AC
  Filled 2013-11-11: qty 1

## 2013-11-11 MED ORDER — FENTANYL CITRATE 0.05 MG/ML IJ SOLN: 25.0000 ug | INTRAMUSCULAR | Status: DC | PRN

## 2013-11-11 MED ORDER — SIMETHICONE 80 MG PO CHEW
80.0000 mg | CHEWABLE_TABLET | Freq: Three times a day (TID) | ORAL | Status: DC
  Administered 2013-11-11 – 2013-11-15 (×11): 80 mg via ORAL
  Filled 2013-11-11 (×11): qty 1

## 2013-11-11 MED ORDER — METOCLOPRAMIDE HCL 5 MG/ML IJ SOLN: 10.0000 mg | Freq: Three times a day (TID) | INTRAMUSCULAR | Status: DC | PRN

## 2013-11-11 MED ORDER — SIMETHICONE 80 MG PO CHEW: 80.0000 mg | CHEWABLE_TABLET | ORAL | Status: DC | PRN

## 2013-11-11 MED ORDER — ACETAMINOPHEN 500 MG PO TABS
1000.0000 mg | ORAL_TABLET | Freq: Four times a day (QID) | ORAL | Status: AC
  Administered 2013-11-11: 1000 mg via ORAL
  Filled 2013-11-11: qty 2

## 2013-11-11 MED ORDER — PRENATAL MULTIVITAMIN CH
1.0000 | ORAL_TABLET | Freq: Every day | ORAL | Status: DC
  Administered 2013-11-12 – 2013-11-15 (×4): 1 via ORAL
  Filled 2013-11-11 (×4): qty 1

## 2013-11-11 MED ORDER — KETOROLAC TROMETHAMINE 30 MG/ML IJ SOLN
30.0000 mg | Freq: Four times a day (QID) | INTRAMUSCULAR | Status: AC | PRN
  Administered 2013-11-11: 30 mg via INTRAMUSCULAR
  Filled 2013-11-11: qty 1

## 2013-11-11 MED ORDER — ONDANSETRON HCL 4 MG PO TABS: 4.0000 mg | ORAL_TABLET | ORAL | Status: DC | PRN

## 2013-11-11 MED ORDER — PHENYLEPHRINE HCL 10 MG/ML IJ SOLN
20.0000 mg | INTRAVENOUS | Status: DC | PRN
  Administered 2013-11-11: 60 ug/min via INTRAVENOUS

## 2013-11-11 MED ORDER — CEFAZOLIN SODIUM-DEXTROSE 2-3 GM-% IV SOLR
INTRAVENOUS | Status: DC | PRN
  Administered 2013-11-11: 2 g via INTRAVENOUS

## 2013-11-11 MED ORDER — LACTATED RINGERS IV SOLN
INTRAVENOUS | Status: DC | PRN
  Administered 2013-11-11: 14:00:00 via INTRAVENOUS

## 2013-11-11 MED ORDER — ONDANSETRON HCL 4 MG/2ML IJ SOLN: 4.0000 mg | Freq: Three times a day (TID) | INTRAMUSCULAR | Status: DC | PRN

## 2013-11-11 MED ORDER — SODIUM CHLORIDE 0.9 % IJ SOLN: 3.0000 mL | INTRAMUSCULAR | Status: DC | PRN

## 2013-11-11 MED ORDER — SENNOSIDES-DOCUSATE SODIUM 8.6-50 MG PO TABS
2.0000 | ORAL_TABLET | ORAL | Status: DC
  Administered 2013-11-11 – 2013-11-14 (×4): 2 via ORAL
  Filled 2013-11-11 (×4): qty 2

## 2013-11-11 MED ORDER — MIDAZOLAM HCL 2 MG/2ML IJ SOLN: 0.5000 mg | Freq: Once | INTRAMUSCULAR | Status: DC | PRN

## 2013-11-11 MED ORDER — WITCH HAZEL-GLYCERIN EX PADS: 1.0000 "application " | MEDICATED_PAD | CUTANEOUS | Status: DC | PRN

## 2013-11-11 MED ORDER — FAMOTIDINE IN NACL 20-0.9 MG/50ML-% IV SOLN
20.0000 mg | Freq: Once | INTRAVENOUS | Status: AC
  Administered 2013-11-11: 20 mg via INTRAVENOUS
  Filled 2013-11-11: qty 50

## 2013-11-11 MED ORDER — DIPHENHYDRAMINE HCL 25 MG PO CAPS: 25.0000 mg | ORAL_CAPSULE | Freq: Four times a day (QID) | ORAL | Status: DC | PRN

## 2013-11-11 MED ORDER — LACTATED RINGERS IV SOLN
INTRAVENOUS | Status: DC
  Administered 2013-11-11 (×2): via INTRAVENOUS

## 2013-11-11 MED ORDER — NALBUPHINE HCL 10 MG/ML IJ SOLN
5.0000 mg | INTRAMUSCULAR | Status: DC | PRN
  Administered 2013-11-11: 10 mg via SUBCUTANEOUS

## 2013-11-11 MED ORDER — DIPHENHYDRAMINE HCL 25 MG PO CAPS
25.0000 mg | ORAL_CAPSULE | ORAL | Status: DC | PRN
  Administered 2013-11-11 – 2013-11-12 (×2): 25 mg via ORAL
  Filled 2013-11-11 (×3): qty 1

## 2013-11-11 MED ORDER — IBUPROFEN 600 MG PO TABS: 600.0000 mg | ORAL_TABLET | Freq: Four times a day (QID) | ORAL | Status: DC | PRN

## 2013-11-11 MED ORDER — SIMETHICONE 80 MG PO CHEW
80.0000 mg | CHEWABLE_TABLET | ORAL | Status: DC
  Administered 2013-11-11 – 2013-11-14 (×4): 80 mg via ORAL
  Filled 2013-11-11 (×5): qty 1

## 2013-11-11 MED ORDER — TETANUS-DIPHTH-ACELL PERTUSSIS 5-2.5-18.5 LF-MCG/0.5 IM SUSP: 0.5000 mL | Freq: Once | INTRAMUSCULAR | Status: DC

## 2013-11-11 MED ORDER — NALOXONE HCL 1 MG/ML IJ SOLN
1.0000 ug/kg/h | INTRAMUSCULAR | Status: DC | PRN
  Filled 2013-11-11: qty 2

## 2013-11-11 MED ORDER — SCOPOLAMINE 1 MG/3DAYS TD PT72
1.0000 | MEDICATED_PATCH | Freq: Once | TRANSDERMAL | Status: AC
  Administered 2013-11-11: 1.5 mg via TRANSDERMAL

## 2013-11-11 MED ORDER — NALBUPHINE HCL 10 MG/ML IJ SOLN
INTRAMUSCULAR | Status: AC
  Filled 2013-11-11: qty 1

## 2013-11-11 MED ORDER — DIBUCAINE 1 % RE OINT: 1.0000 "application " | TOPICAL_OINTMENT | RECTAL | Status: DC | PRN

## 2013-11-11 MED ORDER — OXYCODONE-ACETAMINOPHEN 5-325 MG PO TABS
1.0000 | ORAL_TABLET | ORAL | Status: DC | PRN
  Administered 2013-11-12 (×2): 1 via ORAL
  Administered 2013-11-12 – 2013-11-14 (×3): 2 via ORAL
  Filled 2013-11-11 (×2): qty 1
  Filled 2013-11-11 (×3): qty 2

## 2013-11-11 SURGICAL SUPPLY — 35 items
BENZOIN TINCTURE PRP APPL 2/3 (GAUZE/BANDAGES/DRESSINGS) ×2 IMPLANT
CLAMP CORD UMBIL (MISCELLANEOUS) ×2 IMPLANT
CLOTH BEACON ORANGE TIMEOUT ST (SAFETY) ×2 IMPLANT
DERMABOND ADVANCED (GAUZE/BANDAGES/DRESSINGS)
DERMABOND ADVANCED .7 DNX12 (GAUZE/BANDAGES/DRESSINGS) IMPLANT
DRAPE LG THREE QUARTER DISP (DRAPES) ×4 IMPLANT
DRSG OPSITE POSTOP 4X10 (GAUZE/BANDAGES/DRESSINGS) ×2 IMPLANT
DURAPREP 26ML APPLICATOR (WOUND CARE) ×2 IMPLANT
ELECT REM PT RETURN 9FT ADLT (ELECTROSURGICAL) ×2
ELECTRODE REM PT RTRN 9FT ADLT (ELECTROSURGICAL) ×1 IMPLANT
EXTRACTOR VACUUM KIWI (MISCELLANEOUS) ×2 IMPLANT
EXTRACTOR VACUUM M CUP 4 TUBE (SUCTIONS) IMPLANT
GLOVE BIO SURGEON STRL SZ 6 (GLOVE) ×2 IMPLANT
GLOVE BIO SURGEON STRL SZ 6.5 (GLOVE) ×2 IMPLANT
GLOVE BIOGEL PI IND STRL 6 (GLOVE) ×2 IMPLANT
GLOVE BIOGEL PI IND STRL 7.0 (GLOVE) ×1 IMPLANT
GLOVE BIOGEL PI INDICATOR 6 (GLOVE) ×2
GLOVE BIOGEL PI INDICATOR 7.0 (GLOVE) ×1
GOWN STRL REUS W/TWL LRG LVL3 (GOWN DISPOSABLE) ×4 IMPLANT
KIT ABG SYR 3ML LUER SLIP (SYRINGE) ×2 IMPLANT
NEEDLE HYPO 25X5/8 SAFETYGLIDE (NEEDLE) ×2 IMPLANT
NS IRRIG 1000ML POUR BTL (IV SOLUTION) ×2 IMPLANT
PACK C SECTION WH (CUSTOM PROCEDURE TRAY) ×2 IMPLANT
PAD OB MATERNITY 4.3X12.25 (PERSONAL CARE ITEMS) ×2 IMPLANT
STAPLER VISISTAT 35W (STAPLE) IMPLANT
STRIP CLOSURE SKIN 1/2X4 (GAUZE/BANDAGES/DRESSINGS) ×2 IMPLANT
SUT CHROMIC 0 CTX 36 (SUTURE) ×6 IMPLANT
SUT MON AB 2-0 CT1 27 (SUTURE) ×2 IMPLANT
SUT PDS AB 0 CT1 27 (SUTURE) ×2 IMPLANT
SUT PLAIN 0 NONE (SUTURE) ×2 IMPLANT
SUT VIC AB 0 CT1 36 (SUTURE) IMPLANT
SUT VIC AB 4-0 KS 27 (SUTURE) ×2 IMPLANT
TOWEL OR 17X24 6PK STRL BLUE (TOWEL DISPOSABLE) ×4 IMPLANT
TRAY FOLEY CATH 14FR (SET/KITS/TRAYS/PACK) IMPLANT
WATER STERILE IRR 1000ML POUR (IV SOLUTION) IMPLANT

## 2013-11-11 NOTE — Transfer of Care (Signed)
Immediate Anesthesia Transfer of Care Note  Patient: Nancy [name]  Procedure(s) Performed: Procedure(s): CESAREAN SECTION With Bilateral Tubal Ligation (N/A)  Patient Location: PACU  Anesthesia Type:Spinal  Level of Consciousness: awake, alert , oriented and patient cooperative  Airway & Oxygen Therapy: Patient Spontanous Breathing  Post-op Assessment: Report given to PACU RN and Post -op Vital signs reviewed and stable  Post vital signs: Reviewed and stable  Complications: No apparent anesthesia complications

## 2013-11-11 NOTE — Anesthesia Postprocedure Evaluation (Signed)
  Anesthesia Post-op Note  Anesthesia Post Note  Patient: Nancy [name]  Procedure(s) Performed: Procedure(s) (LRB): CESAREAN SECTION With Bilateral Tubal Ligation (N/A)  Anesthesia type: Spinal  Patient location: PACU  Post pain: Pain level controlled  Post assessment: Post-op Vital signs reviewed  Last Vitals:  Filed Vitals:   11/11/13 1543  BP:   Pulse: 50  Temp: 36.7 C  Resp:     Post vital signs: Reviewed  Level of consciousness: awake  Complications: No apparent anesthesia complications

## 2013-11-11 NOTE — Progress Notes (Signed)
Antibodies positive on type and screen.  Will proceed with case while labs are still pending.    Mitchel HonourMegan Aarit Kashuba, DO

## 2013-11-11 NOTE — Op Note (Signed)
Nancy [name] PROCEDURE DATE: 11/11/2013  PREOPERATIVE DIAGNOSIS: Intrauterine pregnancy at  7633w4d weeks gestation with previous C/S and IUGR, abnormal Doppler's of Twin B, desire for permanent sterilization.  POSTOPERATIVE DIAGNOSIS: The same  PROCEDURE:   Repeat Low Transverse Cesarean Section with Bilateral Tubal Ligation  SURGEON:  Dr. Mitchel HonourMegan Twan Harkin  INDICATIONS: Nancy [name] is a 23 y.o. [phone] at 7433w4d scheduled for cesarean section secondary history of previous C/S and IUGR, abnormal Doppler's in Twin B.  The risks of cesarean section discussed with the patient included but were not limited to: bleeding which may require transfusion or reoperation; infection which may require antibiotics; injury to bowel, bladder, ureters or other surrounding organs; injury to the fetus; need for additional procedures including hysterectomy in the event of a life-threatening hemorrhage; placental abnormalities wth subsequent pregnancies, incisional problems, thromboembolic phenomenon and other postoperative/anesthesia complications. The patient concurred with the proposed plan, giving informed written consent for the procedure.    FINDINGS:  Twin A-viable female infant in cephalic presentation, APGARs 7,8:  Weight pending.  Twin B-viable female infant in breech presentation, tight nuchal cord x 2, APGARs 4,7, weight pending.  Clear amniotic fluid.  Intact placenta, three vessel cord.  Grossly normal uterus, ovaries and fallopian tubes. .   ANESTHESIA:    Spinal ESTIMATED BLOOD LOSS: 600 ml SPECIMENS: Placenta sent to pathology COMPLICATIONS: None immediate  PROCEDURE IN DETAIL:  The patient received intravenous antibiotics and had sequential compression devices applied to her lower extremities while in the preoperative area.  She was then taken to the operating room where spinal anesthesia was administered and was found to be adequate. She was then placed in a dorsal supine position with a  leftward tilt, and prepped and draped in a sterile manner.  A foley catheter was placed into her bladder and attached to constant gravity.  After an adequate timeout was performed, a Pfannenstiel skin incision was made with scalpel and carried through to the underlying layer of fascia. The fascia was incised in the midline and this incision was extended bilaterally using the Mayo scissors. Kocher clamps were applied to the superior aspect of the fascial incision and the underlying rectus muscles were dissected off bluntly. A similar process was carried out on the inferior aspect of the facial incision. The rectus muscles were separated in the midline bluntly and the peritoneum was entered bluntly.   A bladder blade was created sharply and developed bluntly.  Bladder was protected behind the bladder blade.  A transverse hysterotomy was made with a scalpel and extended bilaterally bluntly. The bladder blade was then removed. Twin A was successfully delivered from vertex presentation, and cord was clamped and cut and infant was handed over to awaiting neonatology team. Twin B was successfully delivered from breech presentation using typical breech maneuvers.  Tight nuchal cord x 2 was noted.  Cord was clamped, cut and baby handed off to awaiting neonatology team.  Uterine massage was then administered and the placenta delivered intact with three-vessel cord. The uterus was cleared of clot and debris.  The hysterotomy was closed with #1 Chromic in a single layer to hemostasis.  Attention was turned to the fallopian tubes.  The right tube was elevated, doubly tied with plain gut and the tubal knuckle was excised with excellent hemostasis.  The left tube was treated in the same way.  The peritoneum and rectus muscles were noted to be hemostatic.  The peritoneum was closed with 2-0 monocryl in a running fashion and  the rectus muscles plicated.  The fascia was closed with PDS in a running fashion with good restoration of  anatomy.  The subcutaneus tissue was copiously irrigated.  The skin was closed with 4-0 vicryl in a subcuticular fashion.  Pt tolerated the procedure will.  All counts were correct x2.  Pt went to the recovery room in stable condition.

## 2013-11-11 NOTE — Anesthesia Procedure Notes (Signed)
Spinal  Patient location during procedure: OR Start time: 11/11/2013 1:54 PM Staffing Performed by: anesthesiologist  Preanesthetic Checklist Completed: patient identified, site marked, surgical consent, pre-op evaluation, timeout performed, IV checked, risks and benefits discussed and monitors and equipment checked Spinal Block Patient position: sitting Prep: site prepped and draped and DuraPrep Patient monitoring: heart rate, cardiac monitor, continuous pulse ox and blood pressure Approach: midline Location: L3-4 Injection technique: single-shot Needle Needle type: Pencan  Needle gauge: 24 G Needle length: 9 cm Assessment Sensory level: T4 Additional Notes Clear free flow CSF on first attempt.  No paresthesia.  Patient tolerated procedure well with no apparent complications.  Jasmine DecemberA. Cassidy, MD

## 2013-11-11 NOTE — Progress Notes (Signed)
[facility] ultrasound  Indication: 23 yr old G2P1001 at 6157w4d with dichorionic/diamniotic twin gestation for fetal growth.  Findings: 1. Dichorionic/diamniotic twin gestation; the dividing membranes are seen. 2. Estimated fetal weight for twin A is in the 58th%; twin B is in the <10th%. 3. Both placentas are anterior without evidence of previa. 4. Normal amniotic fluid volume for both fetuses. 5. The limited anatomy survey for both fetuses is normal. 6. Normal biophysical profiles of 8/8 for both fetuses. 7. Twin A has normal umbilical artery Doppler studies; twin B has elevated systolic/diastolic ratio on umbilical artery Doppler studies; there is persistent forward flow. 8. Twin A is in cephalic presentation; twin B is in breech presentation.  Recommendations: 1. Fetal growth restriction in twin B: - twin B has had minimal interval fetal growth - discussed in the setting of abnormal Doppler studies there is a significant increased risk of adverse outcomes including stillbirth; therefore given >34 weeks recommend delivery 2. Anatomy survey remains limited 3. Patient is for repeat C section: instructed to remain NPO  Discussed with Dr. Langston MaskerMorris who is in agreement with the plan and the patient was sent to L&D for delivery  Eulis FosterKristen Daejah Klebba, MD

## 2013-11-11 NOTE — H&P (Signed)
Nancy [name] is a 23 y.o. female presenting for repeat cesarean section secondary to IUGR and abnormal Doppler's of Twin B per MFM recommendation.  Antepartum course has been complicated by Di/Di twins with discordant growth, Rh negative, and previous CD.  She does want a BTL.    Maternal Medical History:  Fetal activity: Perceived fetal activity is normal.   Last perceived fetal movement was within the past hour.    Prenatal complications: IUGR.   Prenatal Complications - Diabetes: none.    OB History   Grav Para Term Preterm Abortions TAB SAB Ect Mult Living   2 1 1  0 0 0 0 0 0 1     Past Medical History  Diagnosis Date  . Medical history non-contributory    Past Surgical History  Procedure Laterality Date  . Cesarean section     Family History: family history is not on file. Social History:  reports that she has never smoked. She has never used smokeless tobacco. She reports that she does not drink alcohol or use illicit drugs.   Prenatal Transfer Tool  Maternal Diabetes: No Genetic Screening: Normal Maternal Ultrasounds/Referrals: Normal Fetal Ultrasounds or other Referrals:  Referred to Materal Fetal Medicine  Maternal Substance Abuse:  No Significant Maternal Medications:  None Significant Maternal Lab Results:  Lab values include: Rh negative Other Comments:  Twin B with growth <10% and abnormal S/D.  Both BPP 8/8  ROS    Blood pressure 113/80, pulse 78, temperature 98.4 F (36.9 C), temperature source Oral, resp. rate 16, height 5\' 1"  (1.549 m), weight 146 lb 4 oz (66.339 kg), last menstrual period 03/07/2013. Maternal Exam:  Abdomen: Patient reports no abdominal tenderness. Fundal height is c/w dates.   Estimated fetal weight is 5,9 and 3,13.   Fetal presentation: vertex     Physical Exam  Constitutional: She is oriented to person, place, and time. She appears well-developed and well-nourished.  GI: Soft. There is no rebound and no guarding.   Neurological: She is alert and oriented to person, place, and time.  Skin: Skin is warm and dry.  Psychiatric: She has a normal mood and affect. Her behavior is normal.    Prenatal labs: ABO, Rh:   Antibody:   Rubella:   RPR:    HBsAg:    HIV:    GBS:     Assessment/Plan: 23 yo G2P1001 at 1031w4d for repeat C/S -C/S with BTL   Yaasir Menken 11/11/2013, 12:44 PM

## 2013-11-11 NOTE — MAU Note (Signed)
Patient sent from MFM tp prep for cesarean section of twins.

## 2013-11-11 NOTE — Anesthesia Preprocedure Evaluation (Addendum)
Anesthesia Evaluation  Patient identified by MRN, date of birth, ID band Patient awake    Reviewed: Allergy & Precautions, H&P , NPO status , Patient's Chart, lab work & pertinent test results, reviewed documented beta blocker date and time   History of Anesthesia Complications Negative for: history of anesthetic complications  Airway Mallampati: II TM Distance: >3 FB Neck ROM: full    Dental  (+) Teeth Intact   Pulmonary neg pulmonary ROS,  breath sounds clear to auscultation        Cardiovascular Exercise Tolerance: Good negative cardio ROS  Rhythm:regular Rate:Normal     Neuro/Psych negative neurological ROS  negative psych ROS   GI/Hepatic negative GI ROS, Neg liver ROS,   Endo/Other  negative endocrine ROS  Renal/GU negative Renal ROS     Musculoskeletal   Abdominal   Peds  Hematology  (+) anemia ,   Anesthesia Other Findings NPO since last night  Reproductive/Obstetrics (+) Pregnancy (h/o C/S x1, twins at 34 weeks, abnormal dopplers - for repeat C/S and BTL)                          Anesthesia Physical Anesthesia Plan  ASA: II and emergent  Anesthesia Plan: Spinal   Post-op Pain Management:    Induction:   Airway Management Planned:   Additional Equipment:   Intra-op Plan:   Post-operative Plan:   Informed Consent: I have reviewed the patients History and Physical, chart, labs and discussed the procedure including the risks, benefits and alternatives for the proposed anesthesia with the patient or authorized representative who has indicated his/her understanding and acceptance.     Plan Discussed with: Anesthesiologist, CRNA and Surgeon  Anesthesia Plan Comments:        Anesthesia Quick Evaluation

## 2013-11-12 ENCOUNTER — Encounter (HOSPITAL_COMMUNITY): Payer: Self-pay | Admitting: Obstetrics & Gynecology

## 2013-11-12 LAB — CBC
HCT: 29.2 % — ABNORMAL LOW (ref 36.0–46.0)
HEMOGLOBIN: 9.7 g/dL — AB (ref 12.0–15.0)
MCH: 26.6 pg (ref 26.0–34.0)
MCHC: 33.2 g/dL (ref 30.0–36.0)
MCV: 80.2 fL (ref 78.0–100.0)
Platelets: 120 10*3/uL — ABNORMAL LOW (ref 150–400)
RBC: 3.64 MIL/uL — ABNORMAL LOW (ref 3.87–5.11)
RDW: 13.1 % (ref 11.5–15.5)
WBC: 9.8 10*3/uL (ref 4.0–10.5)

## 2013-11-12 NOTE — Progress Notes (Signed)
Subjective: Postpartum Day 1: Cesarean Delivery Patient reports tolerating PO.   Babies stable in NICU , on room air Objective: Vital signs in last 24 hours: Temp:  [97.6 F (36.4 C)-98.4 F (36.9 C)] 97.7 F (36.5 C) (03/10 0138) Pulse Rate:  [50-78] 66 (03/10 0138) Resp:  [13-20] 18 (03/10 0138) BP: (112-155)/(67-88) 112/73 mmHg (03/10 0138) SpO2:  [96 %-100 %] 97 % (03/10 0138) Weight:  [146 lb 4 oz (66.339 kg)] 146 lb 4 oz (66.339 kg) (03/09 1109)  Physical Exam:  General: alert and cooperative Lochia: appropriate Uterine Fundus: firm Incision: honeycomb dressing CDI DVT Evaluation: No evidence of DVT seen on physical exam. Negative Homan's sign. No cords or calf tenderness. No significant calf/ankle edema.  foley discontinued, has not voided at the time of rounding   Recent Labs  11/11/13 1130 11/12/13 0535  HGB 10.1* 9.7*  HCT 30.3* 29.2*    Assessment/Plan: Status post Cesarean section. Doing well postoperatively.  Continue current care.  Sheronda Parran G 11/12/2013, 9:02 AM

## 2013-11-12 NOTE — Addendum Note (Signed)
Addendum created 11/12/13 0740 by Renford DillsJanet L Shalice Woodring, CRNA   Modules edited: Notes Section   Notes Section:  File: [id number]

## 2013-11-12 NOTE — Anesthesia Postprocedure Evaluation (Signed)
  Anesthesia Post-op Note  Patient: Lum KeasBrittany M Bozard  Procedure(s) Performed: Procedure(s): CESAREAN SECTION With Bilateral Tubal Ligation (N/A)  Patient Location: [facility]  Anesthesia Type:Spinal  Level of Consciousness: awake  Airway and Oxygen Therapy: Patient Spontanous Breathing  Post-op Pain: mild  Post-op Assessment: Patient's Cardiovascular Status Stable and Respiratory Function Stable  Post-op Vital Signs: stable  Complications: No apparent anesthesia complications

## 2013-11-12 NOTE — Progress Notes (Signed)
Clinical Social Work Department PSYCHOSOCIAL ASSESSMENT - MATERNAL/CHILD 11/12/2013  Patient:  Nancy [name],Nancy [name]  Account Number:  [id number]  Admit Date:  11/11/2013  Childs Name:   [name]  B-[name]    Clinical Social Worker:  Elieser Tetrick, LCSW   Date/Time:  11/12/2013 10:30 AM  Date Referred:  11/12/2013   Referral source  NICU     Referred reason  NICU   Other referral source:    I:  FAMILY / HOME ENVIRONMENT Child's legal guardian:  PARENT  Guardian - Name Guardian - Age Guardian - Address  Manreet Richadson [address] [place], [place] [address]  Ricky Buzzell  same   Other household support members/support persons Name Relationship DOB  Tristen SON 02/2011   Other support:   MOB states her mother is her greatest support person in addition to her husband.  She states her husband's family is also involved and supportive.    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  FOB works in IT.   Financial resources:  Medicaid If Medicaid - County:  [place]  School / Grade:   Maternity Care Coordinator / Child Services Coordination / Early Interventions:  Cultural issues impacting care:   None stated    III  STRENGTHS Strengths  Adequate Resources  Compliance with medical plan  Home prepared for Child (including basic supplies)  Other - See comment  Supportive family/friends  Understanding of illness   Strength comment:  Pediatric follow up will be with Dr. Rubin   IV  RISK FACTORS AND CURRENT PROBLEMS Current Problem:  None   Risk Factor & Current Problem Patient Issue Family Issue Risk Factor / Current Problem Comment   N N     V  SOCIAL WORK ASSESSMENT  CSW met with MOB in her third floor room to introduce myself and complete  assessment for admission of twin boys to the NICU yesterday at 34 weeks.  MOB was pumping, but stated that CSW could come in and talk with her at this time.  She was quiet, but pleasant and states she and the babies are doing well at this time.  She states she had a c/section date scheduled for April 2nd and that delivering them yesterday was unexpected and scary for her.  She states she feels better today knowing they are doing okay.  She seems to have a good understanding of their medical needs and what they will have to accomplish before being able to go home.  She reports no issues with transportation or child care in order to come back to the hospital to visit them after her discharge.  She states she has a good support system and everything she needs for twins at home.  She states she still needs to get the crib from her husband's sister's house.  CSW recommends to MOB that she have separate beds for babies (per SIDS research recommendations) and asked her to let CSW know if she is unable to get a second bed.  She agreed and thanked CSW for the information.  MOB states she was shocked to find out she was having twins and states she and FOB are happy about the babies, however, this was not a planned pregnancy.  They have a 3 year old son at home, and   MOB admits that she is a little nervous about having twins.  CSW commended her for her honesty and talked more about her emotions.  MOB states overall she is feeling well and denies any current mental health concerns or hx of PPD after her first child.  CSW talked about normal/common emotions during the post partum period and related to the NICU experience, as well as symptoms that are more concerning.  CSW asked MOB to contact CSW and/or her doctor's office if she is concerned about her feelings at any time, or if she is experiencing any symptoms of PPD that we discussed.  She agreed.  CSW explained ongoing support services offered by NICU CSW and gave contact  information.  CSW asked her to call any time.  MOB seemed appreciative of the visit.     VI SOCIAL WORK PLAN Social Work Plan  Psychosocial Support/Ongoing Assessment of Needs  Patient/Family Education   Type of pt/family education:   Ongoing support services offered by NICU CSW  PPD signs and symptoms   If child protective services report - county:   If child protective services report - date:   Information/referral to community resources comment:   no referral needs noted at this time.   Other social work plan:   

## 2013-11-12 NOTE — Lactation Note (Signed)
This note was copied from the chart of [name]. Lactation Consultation Note     Initial consult with this mom of NICU twins, now 28 weeks post partum, and 34 5/6 weeks corrected gestation. Mom wants to breast feed her babies - with her first she tried pumping, fo 3 weeks only, and then stopped. She did not latch with her first.  Teaching done on pumping from the NICU booklet, premie setting shoed to mom, and hand expression taught , with good return demonstration from mom. Mom has expressed up to 30 mls of colostrum with the first pumping, and is getting about 1 ml with hand expression today. Skin to skin enocuraged, and mom is aware I will help her with latching her babies, when they are ready to do so.   Patient Name: Nancy ArbourBoyA Nancy [name] ZOXWR'UToday's Date: 11/12/2013 Reason for consult: Initial assessment;NICU baby;Late preterm infant;Multiple gestation   Maternal Data Formula Feeding for Exclusion: Yes (baby is in NICU) Infant to breast within first hour of birth: No Breastfeeding delayed due to:: Infant status Has patient been taught Hand Expression?: Yes Does the patient have breastfeeding experience prior to this delivery?: Yes  Feeding Feeding Type: Formula Nipple Type: Slow - flow Length of feed: 15 min  LATCH Score/Interventions                      Lactation Tools Discussed/Used Tools: Pump Breast pump type: Double-Electric Breast Pump WIC Program: No (mom will call to apply for WIC, is on Medicaid) Pump Review: Setup, frequency, and cleaning;Milk Storage;Other (comment) (hand expression, premie setting, teaching friom NICU booklet on providint EBM) Initiated by:: bedside RN Date initiated:: 11/13/13   Consult Status Consult Status: Follow-up Date: 11/13/13 Follow-up type: In-patient    Nancy [name], [name] 11/12/2013, 6:54 PM

## 2013-11-13 ENCOUNTER — Other Ambulatory Visit: Payer: Medicaid Other

## 2013-11-13 LAB — CBC
HEMATOCRIT: 26.3 % — AB (ref 36.0–46.0)
Hemoglobin: 8.9 g/dL — ABNORMAL LOW (ref 12.0–15.0)
MCH: 27.5 pg (ref 26.0–34.0)
MCHC: 33.8 g/dL (ref 30.0–36.0)
MCV: 81.2 fL (ref 78.0–100.0)
Platelets: 112 10*3/uL — ABNORMAL LOW (ref 150–400)
RBC: 3.24 MIL/uL — AB (ref 3.87–5.11)
RDW: 13.3 % (ref 11.5–15.5)
WBC: 7.5 10*3/uL (ref 4.0–10.5)

## 2013-11-13 NOTE — Lactation Note (Signed)
This note was copied from the chart of [name]. Lactation Consultation Note    Follow up consult with this mom of NICU twins. , now 44 hours post partum, and babies are 34 6/7 weeks corrected gestation. Parents informed me that their first child did not tolerate regular formula, and was on soy based formula. Both twins have ben spitting. Mom is pumping, and [facility] and set up an application appointment. [facility]. Paper work for Development worker, communityloaner given to mom.  Patient Name: Nancy [name] [name] EXBMW'UToday's Date: 11/13/2013 Reason for consult: Follow-up assessment;NICU baby;Multiple gestation   Maternal Data    Feeding Feeding Type: Formula Length of feed: 30 min  LATCH Score/Interventions                      Lactation Tools Discussed/Used WIC Program: No (mmom needs to call for wic appointent to apply, so she can et a [employer] loaner DEP opn discharge tommorrow)   Consult Status Consult Status: Follow-up Date: 11/14/13 Follow-up type: In-patient    Alfred LevinsLee, Jkwon Treptow Anne 11/13/2013, 10:51 AM

## 2013-11-13 NOTE — Progress Notes (Signed)
Subjective: Postpartum Day 2: Cesarean Delivery Patient reports tolerating PO and no problems voiding.    Objective: Vital signs in last 24 hours: Temp:  [97.9 F (36.6 C)-98.2 F (36.8 C)] 98.2 F (36.8 C) (03/11 0535) Pulse Rate:  [60-81] 66 (03/11 0535) Resp:  [16-18] 16 (03/11 0535) BP: (100-125)/(62-74) 106/67 mmHg (03/11 0535) SpO2:  [98 %-100 %] 100 % (03/11 0535)  Physical Exam:  General: alert and cooperative Lochia: appropriate Uterine Fundus: firm Incision: honeycomb dressing with small old drainage noted on bandage DVT Evaluation: No evidence of DVT seen on physical exam. Negative Homan's sign. No cords or calf tenderness. No significant calf/ankle edema.   Recent Labs  11/12/13 0535 11/13/13 0548  HGB 9.7* 8.9*  HCT 29.2* 26.3*    Assessment/Plan: Status post Cesarean section. Doing well postoperatively.  Continue current care.  Kennis Buell G 11/13/2013, 9:03 AM

## 2013-11-14 NOTE — Lactation Note (Signed)
This note was copied from the chart of BoyA Leda QuailBrittany Kumagai. Lactation Consultation Note     Follow up consult with this mom of NICU twins, now 68 hours post partum, and 35 weeks corrected gestation today. Mom reports an increasing amount of milk expressed. I advised her to switch to standard setting now, and to pump 15-30 minutes, until she stops dripping. Mom has been pumping one breast at a time. I explained why she should pump both at the same time, and encouraged her to do so.  She was encouraged to call WIC, and make an appointment to apply. I will also see if babies and mom are ready to nuzzle at the breast.   Patient Name: Nancy ArbourBoyA Nancy [name] [id number]'XToday's Date: 11/14/2013 Reason for consult: Follow-up assessment;NICU baby;Multiple gestation;Late preterm infant   Maternal Data    Feeding Feeding Type: Formula Length of feed: 30 min  LATCH Score/Interventions                      Lactation Tools Discussed/Used WIC Program: No (mom needs to call to make an appointment to apply, so she can loan a DEP on discharge)   Consult Status Consult Status: Follow-up Date: 11/15/13 Follow-up type: In-patient    Alfred LevinsLee, Pinkey Mcjunkin Anne 11/14/2013, 11:03 AM

## 2013-11-14 NOTE — Progress Notes (Signed)
11/14/13 1600  Clinical Encounter Type  Visited With Patient and family together  Visit Type Spiritual support;Social support (Babies in NICU)  Referral From Nurse  Spiritual Encounters  Spiritual Needs Emotional  Stress Factors  Patient Stress Factors Loss of control;Major life changes (unexpected early delivery; twins; 23yo at home)   Met Nancy [name] and husband Nancy [name] on [facility] to introduce Spiritual Care and chaplain availability.  [name] reports that her physical recovery is going better than her first c-section.  Nancy [name] reports that this is a very stressful time and that he already struggles with stress.  Offered spiritual and emotional support, including tools to cope with stress, prayer/blessing as desired, and encouragement to ask questions of all care providers.  Family appreciates as much information as possible, especially about babies [name] and [name].  Family may desire prayer/blessing as their journey unfolds.  Spiritual Care will follow, but please also page as needs arise:  [phone].  Thank you.  Chaplain Lisa Lundeen, MDiv [phone] 

## 2013-11-14 NOTE — Progress Notes (Signed)
Subjective: Postpartum Day 3: Cesarean Delivery Patient reports tolerating PO, + flatus, + BM and no problems voiding.  Babies stable in NICU, on room air  Objective: Vital signs in last 24 hours: Temp:  [97.8 F (36.6 C)-98.1 F (36.7 C)] 98.1 F (36.7 C) (03/12 0539) Pulse Rate:  [68-82] 82 (03/11 2105) Resp:  [16-20] 16 (03/12 0539) BP: (107-144)/(59-78) 144/59 mmHg (03/12 0539) SpO2:  [99 %-100 %] 99 % (03/12 0539)  Physical Exam:  General: alert and cooperative Lochia: appropriate Uterine Fundus: firm Incision: small drainage noted on bandage DVT Evaluation: No evidence of DVT seen on physical exam. Negative Homan's sign. No cords or calf tenderness. No significant calf/ankle edema.   Recent Labs  11/12/13 0535 11/13/13 0548  HGB 9.7* 8.9*  HCT 29.2* 26.3*    Assessment/Plan: Status post Cesarean section. Doing well postoperatively.  Continue current care.  Nancy [name] 11/14/2013, 9:00 AM

## 2013-11-15 ENCOUNTER — Ambulatory Visit: Payer: Self-pay

## 2013-11-15 DIAGNOSIS — IMO0001 Reserved for inherently not codable concepts without codable children: Secondary | ICD-10-CM | POA: Diagnosis present

## 2013-11-15 DIAGNOSIS — Z3490 Encounter for supervision of normal pregnancy, unspecified, unspecified trimester: Secondary | ICD-10-CM

## 2013-11-15 LAB — TYPE AND SCREEN
ABO/RH(D): O NEG
ANTIBODY SCREEN: POSITIVE
DAT, IGG: NEGATIVE
UNIT DIVISION: 0
Unit division: 0

## 2013-11-15 MED ORDER — OXYCODONE-ACETAMINOPHEN 5-325 MG PO TABS: 1.0000 | ORAL_TABLET | ORAL | Status: DC | PRN

## 2013-11-15 MED ORDER — IBUPROFEN 600 MG PO TABS: 600.0000 mg | ORAL_TABLET | Freq: Four times a day (QID) | ORAL | Status: DC

## 2013-11-15 NOTE — Progress Notes (Signed)
Subjective: Postpartum Day 4: Cesarean Delivery Patient reports tolerating PO, + flatus, + BM and no problems voiding.    Objective: Vital signs in last 24 hours: Temp:  [97.5 F (36.4 C)-98.3 F (36.8 C)] 97.7 F (36.5 C) (03/13 0547) Pulse Rate:  [67-77] 70 (03/12 2146) Resp:  [18] 18 (03/13 0547) BP: (123-132)/(74-85) 123/84 mmHg (03/13 0547) SpO2:  [100 %] 100 % (03/13 0547)  Physical Exam:  General: alert and cooperative Lochia: appropriate Uterine Fundus: firm Incision: healing well DVT Evaluation: No evidence of DVT seen on physical exam. Negative Homan's sign. No cords or calf tenderness. No significant calf/ankle edema.   Recent Labs  11/13/13 0548  HGB 8.9*  HCT 26.3*    Assessment/Plan: Status post Cesarean section. Doing well postoperatively.  Discharge home with standard precautions and return to clinic in 1 week for incision appointment Unable to complete discharge orders, MD needs to sign admission orders.  CURTIS,CAROL G 11/15/2013, 9:07 AM

## 2013-11-15 NOTE — Discharge Summary (Signed)
Obstetric Discharge Summary Reason for Admission: cesarean section Prenatal Procedures: none Intrapartum Procedures: cesarean: low cervical, transverse Postpartum Procedures: none Complications-Operative and Postpartum: none Hemoglobin  Date Value Ref Range Status  11/13/2013 8.9* 12.0 - 15.0 g/dL Final     HCT  Date Value Ref Range Status  11/13/2013 26.3* 36.0 - 46.0 % Final    Physical Exam:  General: alert, cooperative and appears stated age Lochia: appropriate Uterine Fundus: firm Incision: healing well, no significant drainage, no dehiscence DVT Evaluation: No evidence of DVT seen on physical exam. Negative Homan's sign. No cords or calf tenderness.  Discharge Diagnoses: Di/Di twins delivered for Twin B IUGR and abnormal Dopplers  Discharge Information: Date: 11/15/2013 Activity: pelvic rest Diet: routine Medications: PNV, Ibuprofen and Percocet Condition: stable Instructions: refer to practice specific booklet Discharge to: home   Newborn Data:   Beather ArbourRichardson, BoyA Jerrilynn [[id number]][[id number]]  Live born female  Birth Weight: 5 lb 5.7 oz (2430 g) APGAR: 7, 8   Veto KempsRichardson, BoyB GrenadaBrittany [[id number]][[id number]]  Live born female  Birth Weight: 3 lb 15.9 oz (1810 g) APGAR: 4, 7  Home with NICU.  Alyria Krack 11/15/2013, 5:42 PM

## 2013-11-15 NOTE — Progress Notes (Signed)
Pt discharged to home with significant other.  Condition stable. Pt ambulated to NICU with plans to leave hospital from there.  Pt home with loaner breast pump from lactation department.  No equipment for home ordered at discharge.

## 2013-11-15 NOTE — Lactation Note (Signed)
This note was copied from the chart of [name]. Lactation Consultation Note     Follow up consult with this mom of twins, now 624 days old, and 35 1/7 weeks corrected gestation. I assisted mom with latching Nancy [name] for the first time. He was very alert, looking at mom in cross cradle position, but would not latch. Mom reports that he did cue and latch some, during the 30 minute ng feeding. Mom is being discharged to home today, She is doing well with pumping [facility]. I loaned mom a [employer] DEP, and instructed mom in it;suse. Discharge teaching on pumping done. I will work with this mom and her babies in the NICI  Patient Name: Nancy [name] Reason for consult: Follow-up assessment   Maternal Data    Feeding Feeding Type: Breast Fed Length of feed: 30 min  LATCH Score/Interventions Latch: Too sleepy or reluctant, no latch achieved, no sucking elicited. Intervention(s): Skin to skin;Teach feeding cues;Waking techniques  Audible Swallowing: None  Type of Nipple: Everted at rest and after stimulation  Comfort (Breast/Nipple): Soft / non-tender     Hold (Positioning): Assistance needed to correctly position infant at breast and maintain latch. Intervention(s): Breastfeeding basics reviewed;Support Pillows;Position options;Skin to skin  LATCH Score: 5  Lactation Tools Discussed/Used WIC Program: No (mom has appointment for 3/23 to apply, and loaned a [employer] DEP today) Pump Review: Setup, frequency, and cleaning   Consult Status Consult Status: PRN Follow-up type:  (in NICU)    Nancy LevinsLee, Nancy [name] [name], 3:13 PM

## 2013-11-18 ENCOUNTER — Ambulatory Visit: Payer: Self-pay

## 2013-11-18 NOTE — Lactation Note (Signed)
This note was copied from the chart of Nancy Jeliyah Davie. Lactation Consultation Note     Follow up consult with this mom of NICU twins, now 7 days old, and 35 4/7 weeks corrected gestation. Mom is expressing 4 ounces each pumping. She is pumping 7-8 times a day. I encouraged mom to fit in at least 8 times a day, or more, since she is pumping for twins. Mom knows to call for questions/concerns.   Patient Name: Nancy [name] Today's Date: 11/18/2013 Reason for consult: Follow-up assessment;NICU baby;Late preterm infant;Multiple gestation   Maternal Data    Feeding Feeding Type: Formula Nipple Type: Slow - flow Length of feed: 30 min  LATCH Score/Interventions                      Lactation Tools Discussed/Used     Consult Status Consult Status: PRN Follow-up type: In-patient (in NICU)    Tari Lecount Anne 11/18/2013, 11:13 AM    

## 2013-11-20 ENCOUNTER — Ambulatory Visit: Payer: Self-pay

## 2013-11-20 NOTE — Lactation Note (Signed)
This note was copied from the chart of Nancy Leda QuailBrittany Rom. Lactation Consultation Note     Follow up consult with this mom and Baby A - cameron, of twins. They are now 429 days old, and 35 6/7 weeks corrected gestation. i assisted mom with latching him in football hold. He had one self resolved brady and desat, when  I was compressing mom's breasts - flow probably too fast for baby. He did fine the remainder of feeding. He transferred 14 mls in 15 minutes, and audible swallows easily heard. When he unlatched, mom then fed him EBM by bottle. Triple feeding LPT babies/breast feeding reviewed with mom. Mom aware o/p lactation available to help her transition the babies to full breast feeding, after they go home. i suggested that once babies are home, mom breast feed each of them at least once a day. I will continue to work with mom and Camerona and ryan, while they are in the nICU, and o/p after.  Patient Name: Nancy ArbourBoyA Tinnie [name] ZOXWR'UToday's Date: 11/20/2013 Reason for consult: Follow-up assessment   Maternal Data    Feeding Feeding Type: Breast Milk Nipple Type: Slow - flow Length of feed: 15 min  LATCH Score/Interventions Latch: Grasps breast easily, tongue down, lips flanged, rhythmical sucking. Intervention(s): Skin to skin;Teach feeding cues;Waking techniques  Audible Swallowing: Spontaneous and intermittent  Type of Nipple: Everted at rest and after stimulation  Comfort (Breast/Nipple): Soft / non-tender     Hold (Positioning): Assistance needed to correctly position infant at breast and maintain latch. Intervention(s): Breastfeeding basics reviewed;Support Pillows;Position options;Skin to skin  LATCH Score: 9  Lactation Tools Discussed/Used     Consult Status Consult Status: Follow-up Follow-up type:  (prn in NICU)    Nancy [name], Nancy [name] 11/20/2013, 3:37 PM

## 2013-12-05 ENCOUNTER — Inpatient Hospital Stay (HOSPITAL_COMMUNITY)
Admission: RE | Admit: 2013-12-05 | Payer: Medicaid Other | Source: Ambulatory Visit | Admitting: Obstetrics and Gynecology

## 2013-12-05 ENCOUNTER — Encounter (HOSPITAL_COMMUNITY): Admission: RE | Payer: Self-pay | Source: Ambulatory Visit

## 2013-12-05 SURGERY — Surgical Case
Anesthesia: Regional | Laterality: Bilateral

## 2014-03-27 ENCOUNTER — Encounter (HOSPITAL_COMMUNITY): Payer: Self-pay | Admitting: Emergency Medicine

## 2014-03-27 ENCOUNTER — Emergency Department (INDEPENDENT_AMBULATORY_CARE_PROVIDER_SITE_OTHER)
Admission: EM | Admit: 2014-03-27 | Discharge: 2014-03-27 | Disposition: A | Discharge status: Home / Self Care | Payer: Medicaid Other | Source: Home / Self Care | Attending: Emergency Medicine | Admitting: Emergency Medicine

## 2014-03-27 DIAGNOSIS — J020 Streptococcal pharyngitis: Secondary | ICD-10-CM | POA: Diagnosis not present

## 2014-03-27 LAB — POCT RAPID STREP A: STREPTOCOCCUS, GROUP A SCREEN (DIRECT): NEGATIVE

## 2014-03-27 MED ORDER — LIDOCAINE VISCOUS 2 % MT SOLN: 10.0000 mL | OROMUCOSAL | Status: DC | PRN

## 2014-03-27 MED ORDER — AMOXICILLIN 500 MG PO CAPS: 500.0000 mg | ORAL_CAPSULE | Freq: Two times a day (BID) | ORAL | Status: DC

## 2014-03-27 NOTE — ED Provider Notes (Signed)
CSN: [id number]     Arrival date & time 03/27/14  0810 History   First MD Initiated Contact with Patient 03/27/14 0818     Chief Complaint  Patient presents with  . Sore Throat   (Consider location/radiation/quality/duration/timing/severity/associated sxs/prior Treatment) HPI She is here today for evaluation of sore throat. She states her symptoms started 2 days ago with sore throat and headache. She also reports some low back pain. The headache is located frontally. She's tried taking ibuprofen with minimal improvement. She reports subjective fevers, however does she does not have a thermometer at home. Does report chills. She is drinking well, however solid foods are painful to eat so she has been avoiding them. Denies any neck stiffness, nausea, vomiting, diarrhea. She did not have any rhinorrhea or cough. No shortness of breath. No known sick contacts.  Past Medical History  Diagnosis Date  . Medical history non-contributory    Past Surgical History  Procedure Laterality Date  . Cesarean section    . Cesarean section N/A 11/11/2013    Procedure: CESAREAN SECTION With Bilateral Tubal Ligation;  Surgeon: Mitchel Honour, DO;  Location: [facility];  Service: Obstetrics;  Laterality: N/A;   History reviewed. No pertinent family history. History  Substance Use Topics  . Smoking status: Never Smoker   . Smokeless tobacco: Never Used  . Alcohol Use: No   OB History   Grav Para Term Preterm Abortions TAB SAB Ect Mult Living   2 2 1 1  0 0 0 0 1 3     Review of Systems  Constitutional: Positive for fever and chills. Negative for appetite change.  HENT: Positive for sore throat. Negative for congestion, ear pain, rhinorrhea and sinus pressure.   Eyes: Negative.   Respiratory: Negative for cough and shortness of breath.   Cardiovascular: Negative.   Gastrointestinal: Negative.   Genitourinary: Negative.   Musculoskeletal: Positive for back pain. Negative for neck stiffness.  Skin: Negative  for rash.  Neurological: Negative.     Allergies  Review of patient's allergies indicates no known allergies.  Home Medications   Prior to Admission medications   Medication Sig Start Date End Date Taking? Authorizing Provider  acetaminophen (TYLENOL) 500 MG tablet Take 500 mg by mouth every 6 (six) hours as needed for moderate pain.    Historical Provider, MD  amoxicillin (AMOXIL) 500 MG capsule Take 1 capsule (500 mg total) by mouth 2 (two) times daily. 03/27/14   Charm Rings, MD  ibuprofen (ADVIL,MOTRIN) 600 MG tablet Take 1 tablet (600 mg total) by mouth every 6 (six) hours. 11/15/13   Megan Morris, DO  lidocaine (XYLOCAINE) 2 % solution Use as directed 10 mLs in the mouth or throat as needed for mouth pain. 03/27/14   Charm Rings, MD  oxyCODONE-acetaminophen (PERCOCET/ROXICET) 5-325 MG per tablet Take 1-2 tablets by mouth every 4 (four) hours as needed for severe pain (moderate - severe pain). 11/15/13   Mitchel Honour, DO  Prenatal Vit-Fe Fumarate-FA (PRENATAL MULTIVITAMIN) TABS tablet Take 1 tablet by mouth daily at 12 noon.    Historical Provider, MD   BP 118/73  Pulse 62  Temp(Src) 98.1 F (36.7 C) (Oral)  Resp 16  SpO2 100%  LMP 03/12/2014 Physical Exam  Constitutional: She is oriented to person, place, and time. She appears well-developed and well-nourished. No distress.  HENT:  Head: Normocephalic and atraumatic.  Right Ear: Tympanic membrane and external ear normal.  Left Ear: Tympanic membrane and external ear normal.  Nose:  No mucosal edema or rhinorrhea.  Mouth/Throat: Mucous membranes are normal. Oropharyngeal exudate and posterior oropharyngeal erythema present. No tonsillar abscesses.  MMM but slightly dry lips  Eyes: Conjunctivae are normal. Pupils are equal, round, and reactive to light. Right eye exhibits no discharge. Left eye exhibits no discharge.  Neck: Normal range of motion. Neck supple.  Cardiovascular: Normal rate, regular rhythm and normal heart sounds.   Exam reveals no friction rub.   No murmur heard. Pulmonary/Chest: Effort normal and breath sounds normal. No respiratory distress. She has no wheezes. She has no rales.  Lymphadenopathy:    She has no cervical adenopathy.  Neurological: She is alert and oriented to person, place, and time.  Skin: Skin is warm. No rash noted. She is not diaphoretic.  Psychiatric: She has a normal mood and affect. Her behavior is normal.    ED Course  Procedures (including critical care time) Labs Review Labs Reviewed  POCT RAPID STREP A ([facility] CARE ONLY)    Imaging Review No results found.   MDM   1. Strep pharyngitis    Rapid strep is negative, however her history is consistent with strep throat. Will go ahead and treat with amoxicillin 500 twice a day x10 days. Also provided prescription for viscous lidocaine to use as needed for sore throat. Emphasized importance of fluid intake. Followup as needed.   Charm RingsErin J Winston Misner, MD 03/27/14 [phone]

## 2014-03-27 NOTE — Discharge Instructions (Signed)
I think you have strep throat. Take amoxicillin 1 pill twice a day for 10 days. Use the lidocaine mouth wash as needed for throat pain. Drink plenty of fluids!! You should start to feel betting in 24-48 hours.  Follow up as needed.

## 2014-03-27 NOTE — ED Notes (Signed)
Pt  Reports  Symptoms  Of   A  sorethroat  With  Headache  /  Pain  When  She  Swallows        And  abd  Pain    Pt is  Sitting upright on  Exam table  Speaking in  Complete  sentances  And  Is  In no  Distress

## 2014-03-29 LAB — CULTURE, GROUP A STREP

## 2014-07-07 ENCOUNTER — Encounter (HOSPITAL_COMMUNITY): Payer: Self-pay | Admitting: Emergency Medicine

## 2015-01-09 ENCOUNTER — Other Ambulatory Visit (HOSPITAL_COMMUNITY): Payer: Self-pay | Admitting: Obstetrics and Gynecology

## 2015-01-12 LAB — CYTOLOGY - PAP

## 2015-11-29 ENCOUNTER — Emergency Department (HOSPITAL_COMMUNITY)
Admission: EM | Admit: 2015-11-29 | Discharge: 2015-11-29 | Disposition: A | Discharge status: Home / Self Care | Payer: Medicaid Other | Attending: Emergency Medicine | Admitting: Emergency Medicine

## 2015-11-29 ENCOUNTER — Encounter (HOSPITAL_COMMUNITY): Payer: Self-pay | Admitting: Emergency Medicine

## 2015-11-29 DIAGNOSIS — Z79899 Other long term (current) drug therapy: Secondary | ICD-10-CM | POA: Insufficient documentation

## 2015-11-29 DIAGNOSIS — Z792 Long term (current) use of antibiotics: Secondary | ICD-10-CM | POA: Insufficient documentation

## 2015-11-29 DIAGNOSIS — Z791 Long term (current) use of non-steroidal anti-inflammatories (NSAID): Secondary | ICD-10-CM | POA: Insufficient documentation

## 2015-11-29 DIAGNOSIS — M5442 Lumbago with sciatica, left side: Secondary | ICD-10-CM | POA: Diagnosis not present

## 2015-11-29 DIAGNOSIS — M545 Low back pain: Secondary | ICD-10-CM | POA: Diagnosis present

## 2015-11-29 MED ORDER — NAPROXEN 500 MG PO TABS: 500.0000 mg | ORAL_TABLET | Freq: Two times a day (BID) | ORAL | Status: DC

## 2015-11-29 NOTE — ED Notes (Signed)
L lower back pain radiating down L leg, onset 2 weeks ago while exercising. Taking ibuprofen without relief.

## 2015-11-29 NOTE — ED Provider Notes (Signed)
CSN: [id number]     Arrival date & time 11/29/15  1851 History  By signing my name below, I, Nancy [name], attest that this documentation has been prepared under the direction and in the presence of Everlene Farrier, PA-C. Electronically Signed: Octavia [name], ED Scribe. 11/29/2015. 7:16 PM.    Chief Complaint  Patient presents with  . Back Pain      The history is provided by the patient. No language interpreter was used.   HPI Comments: Nancy [name] is a 25 y.o. female who presents to the Emergency Department complaining of constant, gradual worsening, moderate, 9/10, sharp, left lower back pain that radiates down the back of her left leg onset three weeks ago. Pt states the pain started shortly after she started working out 3 weeks ago. She endorses increased pain when sitting for long periods of time and ambulating. She states she took some ibuprofen to alleviate her symptoms with no relief. Denies dysuria, hematuria, urinary frequency, urinary urgency, vaginal bleeding, vaginal discharge, bowel or bladder incontinence, difficulty urinating, abdominal pain, nausea, vomiting, diarrhea, hx of cancer, or fever. Her LMP was 3/15.  PCP: Dr. Huntley Dec. Past Medical History  Diagnosis Date  . Medical history non-contributory    Past Surgical History  Procedure Laterality Date  . Cesarean section    . Cesarean section N/A 11/11/2013    Procedure: CESAREAN SECTION With Bilateral Tubal Ligation;  Surgeon: Mitchel Honour, DO;  Location: [facility];  Service: Obstetrics;  Laterality: N/A;   No family history on file. Social History  Substance Use Topics  . Smoking status: Never Smoker   . Smokeless tobacco: Never Used  . Alcohol Use: No   OB History    Gravida Para Term Preterm AB TAB SAB Ectopic Multiple Living   0 0 0 0 1 3     Review of Systems  Constitutional: Negative for fever.  Cardiovascular: Negative for leg swelling.  Gastrointestinal: Negative for nausea, vomiting,  abdominal pain and diarrhea.  Genitourinary: Negative for dysuria, urgency, frequency, hematuria, vaginal bleeding, vaginal discharge and difficulty urinating.  Musculoskeletal: Positive for back pain. Negative for joint swelling and neck pain.  Skin: Negative for color change, rash and wound.  Neurological: Negative for weakness and numbness.      Allergies  Review of patient's allergies indicates no known allergies.  Home Medications   Prior to Admission medications   Medication Sig Start Date End Date Taking? Authorizing Provider  acetaminophen (TYLENOL) 500 MG tablet Take 500 mg by mouth every 6 (six) hours as needed for moderate pain.    Historical Provider, MD  amoxicillin (AMOXIL) 500 MG capsule Take 1 capsule (500 mg total) by mouth 2 (two) times daily. 03/27/14   Charm Rings, MD  ibuprofen (ADVIL,MOTRIN) 600 MG tablet Take 1 tablet (600 mg total) by mouth every 6 (six) hours. 11/15/13   Megan Morris, DO  lidocaine (XYLOCAINE) 2 % solution Use as directed 10 mLs in the mouth or throat as needed for mouth pain. 03/27/14   Charm Rings, MD  naproxen (NAPROSYN) 500 MG tablet Take 1 tablet (500 mg total) by mouth 2 (two) times daily with a meal. 11/29/15   Everlene Farrier, PA-C  oxyCODONE-acetaminophen (PERCOCET/ROXICET) 5-325 MG per tablet Take 1-2 tablets by mouth every 4 (four) hours as needed for severe pain (moderate - severe pain). 11/15/13   Mitchel Honour, DO  Prenatal Vit-Fe Fumarate-FA (PRENATAL MULTIVITAMIN) TABS tablet Take 1 tablet by mouth daily at  12 noon.    Historical Provider, MD   Triage vitals: BP 112/82 mmHg  Pulse 60  Temp(Src) 98.3 F (36.8 C) (Oral)  Resp 16  Ht  (1.575 m)  Wt 116 lb (52.617 kg)  BMI 21.21 kg/m2  SpO2 100%  LMP 11/18/2015 Physical Exam  Constitutional: She appears well-developed and well-nourished. No distress.  Nontoxic appearing.  HENT:  Head: Normocephalic and atraumatic.  Eyes: Conjunctivae are normal. Pupils are equal, round, and  reactive to light. Right eye exhibits no discharge. Left eye exhibits no discharge.  Neck: Neck supple.  Cardiovascular: Normal rate, regular rhythm, normal heart sounds and intact distal pulses.   Pulmonary/Chest: Effort normal and breath sounds normal. No respiratory distress. She has no wheezes. She has no rales.  Abdominal: Soft. There is no tenderness. There is no guarding.  Soft, non tender  Musculoskeletal: Normal range of motion. She exhibits no edema.  Bilateral patellar DTRs intact, no lumbar or midline back tenderness, normal gait, equal strength in bilateral extremities.   Lymphadenopathy:    She has no cervical adenopathy.  Neurological: She is alert. She has normal reflexes. She displays normal reflexes. Coordination normal.  Bilateral patellar DTRs are intact. Normal gait. Sensation intact to bilateral lower extremities.  Skin: Skin is warm and dry. No rash noted. She is not diaphoretic. No erythema. No pallor.  Psychiatric: She has a normal mood and affect. Her behavior is normal.  Nursing note and vitals reviewed.   ED Course  Procedures  DIAGNOSTIC STUDIES: Oxygen Saturation is 100% on RA, normal by my interpretation.  COORDINATION OF CARE:  7:13 PM Discussed treatment plan which includes back exercises and Naproxen with pt at bedside and pt agreed to plan. Pt was advised to follow up with PCP.  Labs Review Labs Reviewed - No data to display  Imaging Review No results found.    EKG Interpretation None      Filed Vitals:   11/29/15 1856  BP: 112/82  Pulse: 60  Temp: 98.3 F (36.8 C)  TempSrc: Oral  Resp: 16  Height:  (1.575 m)  Weight: 52.617 kg  SpO2: 100%     MDM   Meds given in ED:  Medications - No data to display  New Prescriptions   NAPROXEN (NAPROSYN) 500 MG TABLET    Take 1 tablet (500 mg total) by mouth 2 (two) times daily with a meal.    Final diagnoses:  Left-sided low back pain with left-sided sciatica   This is a 25 y.o.  female who presents to the Emergency Department complaining of constant, gradual worsening, moderate, 9/10, sharp, left lower back pain that radiates down the back of her left leg onset three weeks ago. Pt states the pain started shortly after she started working out 3 weeks ago. She endorses increased pain when sitting for long periods of time and ambulating. She states she took some ibuprofen to alleviate her symptoms with no relief. Denies dysuria, hematuria, urinary frequency, urinary urgency. No urinary symptoms. No abdominal pain.  Pain worse with movement. LMP less than 2 weeks ago.  On exam the patient is afebrile nontoxic appearing. She has no focal neurological deficits. She is able to ambulate with normal gait. Strength and sensation is intact. Patient's pain is worse with movement. I suspect sciatica with her radiation down her posterior left leg. Especially since this was exacerbated with her recent start of exercise. Will forward scribe naproxen and have her follow-up with primary care for referral  to physical therapy. I also encouraged back exercises. I discussed strict and specific return precautions. I advised the patient to follow-up with their primary care provider this week. I advised the patient to return to the emergency department with new or worsening symptoms or new concerns. The patient verbalized understanding and agreement with plan.    I personally performed the services described in this documentation, which was scribed in my presence. The recorded information has been reviewed and is accurate.      Everlene FarrierWilliam Breta Demedeiros, PA-C 11/29/15 1925  Nelva Nayobert Beaton, MD 12/02/15 [phone]

## 2015-11-29 NOTE — Discharge Instructions (Signed)
Back Exercises °The following exercises strengthen the muscles that help to support the back. They also help to keep the lower back flexible. Doing these exercises can help to prevent back pain or lessen existing pain. °If you have back pain or discomfort, try doing these exercises 2-3 times each day or as told by your health care provider. When the pain goes away, do them once each day, but increase the number of times that you repeat the steps for each exercise (do more repetitions). If you do not have back pain or discomfort, do these exercises once each day or as told by your health care provider. °EXERCISES °Single Knee to Chest °Repeat these steps 3-5 times for each leg: °· Lie on your back on a firm bed or the floor with your legs extended. °· Bring one knee to your chest. Your other leg should stay extended and in contact with the floor. °· Hold your knee in place by grabbing your knee or thigh. °· Pull on your knee until you feel a gentle stretch in your lower back. °· Hold the stretch for 10-30 seconds. °· Slowly release and straighten your leg. °Pelvic Tilt °Repeat these steps 5-10 times: °· Lie on your back on a firm bed or the floor with your legs extended. °· Bend your knees so they are pointing toward the ceiling and your feet are flat on the floor. °· Tighten your lower abdominal muscles to press your lower back against the floor. This motion will tilt your pelvis so your tailbone points up toward the ceiling instead of pointing to your feet or the floor. °· With gentle tension and even breathing, hold this position for 5-10 seconds. °Cat-Cow °Repeat these steps until your lower back becomes more flexible: °· Get into a hands-and-knees position on a firm surface. Keep your hands under your shoulders, and keep your knees under your hips. You may place padding under your knees for comfort. °· Let your head hang down, and point your tailbone toward the floor so your lower back becomes rounded like the  back of a cat. °· Hold this position for 5 seconds. °· Slowly lift your head and point your tailbone up toward the ceiling so your back forms a sagging arch like the back of a cow. °· Hold this position for 5 seconds. °Press-Ups °Repeat these steps 5-10 times: °· Lie on your abdomen (face-down) on the floor. °· Place your palms near your head, about shoulder-width apart. °· While you keep your back as relaxed as possible and keep your hips on the floor, slowly straighten your arms to raise the top half of your body and lift your shoulders. Do not use your back muscles to raise your upper torso. You may adjust the placement of your hands to make yourself more comfortable. °· Hold this position for 5 seconds while you keep your back relaxed. °· Slowly return to lying flat on the floor. °Bridges °Repeat these steps 10 times: °· Lie on your back on a firm surface. °· Bend your knees so they are pointing toward the ceiling and your feet are flat on the floor. °· Tighten your buttocks muscles and lift your buttocks off of the floor until your waist is at almost the same height as your knees. You should feel the muscles working in your buttocks and the back of your thighs. If you do not feel these muscles, slide your feet 1-2 inches farther away from your buttocks. °· Hold this position for 3-5   seconds. °· Slowly lower your hips to the starting position, and allow your buttocks muscles to relax completely. °If this exercise is too easy, try doing it with your arms crossed over your chest. °Abdominal Crunches °Repeat these steps 5-10 times: °· Lie on your back on a firm bed or the floor with your legs extended. °· Bend your knees so they are pointing toward the ceiling and your feet are flat on the floor. °· Cross your arms over your chest. °· Tip your chin slightly toward your chest without bending your neck. °· Tighten your abdominal muscles and slowly raise your trunk (torso) high enough to lift your shoulder blades a  tiny bit off of the floor. Avoid raising your torso higher than that, because it can put too much stress on your low back and it does not help to strengthen your abdominal muscles. °· Slowly return to your starting position. °Back Lifts °Repeat these steps 5-10 times: °· Lie on your abdomen (face-down) with your arms at your sides, and rest your forehead on the floor. °· Tighten the muscles in your legs and your buttocks. °· Slowly lift your chest off of the floor while you keep your hips pressed to the floor. Keep the back of your head in line with the curve in your back. Your eyes should be looking at the floor. °· Hold this position for 3-5 seconds. °· Slowly return to your starting position. °SEEK MEDICAL CARE IF: °· Your back pain or discomfort gets much worse when you do an exercise. °· Your back pain or discomfort does not lessen within 2 hours after you exercise. °If you have any of these problems, stop doing these exercises right away. Do not do them again unless your health care provider says that you can. °SEEK IMMEDIATE MEDICAL CARE IF: °· You develop sudden, severe back pain. If this happens, stop doing the exercises right away. Do not do them again unless your health care provider says that you can. °  °This information is not intended to replace advice given to you by your health care provider. Make sure you discuss any questions you have with your health care provider. °  °Document Released: 09/29/2004 Document Revised: 05/13/2015 Document Reviewed: 10/16/2014 °Elsevier Interactive Patient Education ©2016 [employer] °Sciatica °Sciatica is pain, weakness, numbness, or tingling along the path of the sciatic nerve. The nerve starts in the lower back and runs down the back of each leg. The nerve controls the muscles in the lower leg and in the back of the knee, while also providing sensation to the back of the thigh, lower leg, and the sole of your foot. Sciatica is a symptom of another medical  condition. For instance, nerve damage or certain conditions, such as a herniated disk or bone spur on the spine, pinch or put pressure on the sciatic nerve. This causes the pain, weakness, or other sensations normally associated with sciatica. Generally, sciatica only affects one side of the body. °CAUSES  °· Herniated or slipped disc. °· Degenerative disk disease. °· A pain disorder involving the narrow muscle in the buttocks (piriformis syndrome). °· Pelvic injury or fracture. °· Pregnancy. °· Tumor (rare). °SYMPTOMS  °Symptoms can vary from mild to very severe. The symptoms usually travel from the low back to the buttocks and down the back of the leg. Symptoms can include: °· Mild tingling or dull aches in the lower back, leg, or hip. °· Numbness in the back of the calf or sole of the   foot. °· Burning sensations in the lower back, leg, or hip. °· Sharp pains in the lower back, leg, or hip. °· Leg weakness. °· Severe back pain inhibiting movement. °These symptoms may get worse with coughing, sneezing, laughing, or prolonged sitting or standing. Also, being overweight may worsen symptoms. °DIAGNOSIS  °Your caregiver will perform a physical exam to look for common symptoms of sciatica. He or she may ask you to do certain movements or activities that would trigger sciatic nerve pain. Other tests may be performed to find the cause of the sciatica. These may include: °· Blood tests. °· X-rays. °· Imaging tests, such as an MRI or CT scan. °TREATMENT  °Treatment is directed at the cause of the sciatic pain. Sometimes, treatment is not necessary and the pain and discomfort goes away on its own. If treatment is needed, your caregiver may suggest: °· Over-the-counter medicines to relieve pain. °· Prescription medicines, such as anti-inflammatory medicine, muscle relaxants, or narcotics. °· Applying heat or ice to the painful area. °· Steroid injections to lessen pain, irritation, and inflammation around the  nerve. °· Reducing activity during periods of pain. °· Exercising and stretching to strengthen your abdomen and improve flexibility of your spine. Your caregiver may suggest losing weight if the extra weight makes the back pain worse. °· Physical therapy. °· Surgery to eliminate what is pressing or pinching the nerve, such as a bone spur or part of a herniated disk. °HOME CARE INSTRUCTIONS  °· Only take over-the-counter or prescription medicines for pain or discomfort as directed by your caregiver. °· Apply ice to the affected area for 20 minutes, 3-4 times a day for the first 48-72 hours. Then try heat in the same way. °· Exercise, stretch, or perform your usual activities if these do not aggravate your pain. °· Attend physical therapy sessions as directed by your caregiver. °· Keep all follow-up appointments as directed by your caregiver. °· Do not wear high heels or shoes that do not provide proper support. °· Check your mattress to see if it is too soft. A firm mattress may lessen your pain and discomfort. °SEEK IMMEDIATE MEDICAL CARE IF:  °· You lose control of your bowel or bladder (incontinence). °· You have increasing weakness in the lower back, pelvis, buttocks, or legs. °· You have redness or swelling of your back. °· You have a burning sensation when you urinate. °· You have pain that gets worse when you lie down or awakens you at night. °· Your pain is worse than you have experienced in the past. °· Your pain is lasting longer than 4 weeks. °· You are suddenly losing weight without reason. °MAKE SURE YOU: °· Understand these instructions. °· Will watch your condition. °· Will get help right away if you are not doing well or get worse. °  °This information is not intended to replace advice given to you by your health care provider. Make sure you discuss any questions you have with your health care provider. °  °Document Released: 08/16/2001 Document Revised: 05/13/2015 Document Reviewed:  01/01/2012 °Elsevier Interactive Patient Education ©2016 [employer] Inc[employer] ° °

## 2016-01-13 ENCOUNTER — Ambulatory Visit: Payer: Medicaid Other | Attending: Internal Medicine | Admitting: Internal Medicine

## 2016-01-13 ENCOUNTER — Encounter: Payer: Self-pay | Admitting: Internal Medicine

## 2016-01-13 VITALS — BP 111/71 | HR 64 | Temp 98.4°F | Resp 18 | Ht 62.0 in | Wt 119.0 lb

## 2016-01-13 DIAGNOSIS — M545 Low back pain, unspecified: Secondary | ICD-10-CM

## 2016-01-13 MED ORDER — MELOXICAM 15 MG PO TABS: 15.0000 mg | ORAL_TABLET | Freq: Every day | ORAL | Status: DC

## 2016-01-13 NOTE — Assessment & Plan Note (Signed)
No concerning features on hx and exam Suspect lumbago aggravation with ADLs (like carrying 40lb  24yo twins) Ok for daily meloxicam - new rx today (we reviewed potential risk/benefit and possible side effects - pt understands and agrees to same) Also refer to PT as able and given home exercises with instruction follow up prn

## 2016-01-13 NOTE — Progress Notes (Signed)
   Subjective:    Patient ID: Nancy [name], female    DOB: 01/17/1991, 25 y.o.   MRN: [id number]  HPI  Patient here for ED followup - seen approx 6 weeks ago for acute flare of midline back pain with radiation into L thigh. Improves with naprosyn as rx'd but return of back ache (not leg pain) since running out of rx. Back exac by activities such as playig with kids on floor, yard work, carrying her 24 yo twin boys No fever, no injury, trauma or MVA recalled.  No weakness of LLE or rash.  Ongoing intermittent symptoms for 2 years.  On menses cycle now  Past Medical History  Diagnosis Date  . Medical history non-contributory     Review of Systems  Constitutional: Negative for fever, fatigue and unexpected weight change.  Respiratory: Negative for cough and shortness of breath.   Cardiovascular: Negative for chest pain and leg swelling.  Genitourinary: Negative for dysuria, hematuria, flank pain and menstrual problem.  Neurological: Negative for syncope, numbness and headaches.       Objective:    Physical Exam  Constitutional: She appears well-developed and well-nourished. No distress.  Mom at side  Cardiovascular: Normal rate, regular rhythm and normal heart sounds.   No murmur heard. Pulmonary/Chest: Effort normal and breath sounds normal. No respiratory distress.  Musculoskeletal: She exhibits no edema.  Back: full range of motion of thoracic and lumbar spine. Non tender to palpation. Negative straight leg raise. DTR's are symmetrically intact. Sensation intact in all dermatomes of the lower extremities. Full strength to manual muscle testing. patient is able to heel toe walk without difficulty and ambulates with normal gait.    BP 111/71 mmHg  Pulse 64  Temp(Src) 98.4 F (36.9 C) (Oral)  Resp 18  Ht 5\' 2"  (1.575 m)  Wt 119 lb (53.978 kg)  BMI 21.76 kg/m2  SpO2 100%  LMP 01/10/2016 (Approximate) Wt Readings from Last 3 Encounters:  01/13/16 119 lb (53.978 kg)    11/29/15 116 lb (52.617 kg)  11/11/13 146 lb 4 oz (66.339 kg)     Lab Results  Component Value Date   WBC 7.5 11/13/2013   HGB 8.9* 11/13/2013   HCT 26.3* 11/13/2013   PLT 112* 11/13/2013   GLUCOSE 92 04/29/2013   ALT 9 03/01/2011   AST 20 03/01/2011   NA 139 04/29/2013   K 3.6 04/29/2013   CL 105 04/29/2013   CREATININE 0.70 04/29/2013   BUN 5* 04/29/2013   CO2 24 02/09/2013    No results found.     Assessment & Plan:   Problem List Items Addressed This Visit    Low back pain - Primary    No concerning features on hx and exam Suspect lumbago aggravation with ADLs (like carrying 40lb  24yo twins) Ok for daily meloxicam - new rx today (we reviewed potential risk/benefit and possible side effects - pt understands and agrees to same) Also refer to PT as able and given home exercises with instruction follow up prn       Relevant Medications   meloxicam (MOBIC) 15 MG tablet   Other Relevant Orders   Ambulatory referral to Physical Therapy       Rene PaciValerie Leschber, MD

## 2016-01-13 NOTE — Patient Instructions (Addendum)
It was good to see you today.  We have reviewed your prior records including labs and tests today  Medications reviewed and updated  Will start daily meloxicam as discussed for back pain in addition to physical therapy and back exercises EVERY DAY  Your prescription has been submitted to your pharmacy. Please take as directed and contact our office if you believe you are having problem(s) with the medication(s).  we'll make referral to physical therapy. Our office will contact you regarding appointment(s) once made.  Please schedule followup as needed for persisting symptoms, call sooner if problems.  Back Exercises If you have pain in your back, do these exercises 2-3 times each day or as told by your doctor. When the pain goes away, do the exercises once each day, but repeat the steps more times for each exercise (do more repetitions). If you do not have pain in your back, do these exercises once each day or as told by your doctor. EXERCISES Single Knee to Chest Do these steps 3-5 times in a row for each leg:  Lie on your back on a firm bed or the floor with your legs stretched out.  Bring one knee to your chest.  Hold your knee to your chest by grabbing your knee or thigh.  Pull on your knee until you feel a gentle stretch in your lower back.  Keep doing the stretch for 10-30 seconds.  Slowly let go of your leg and straighten it. Pelvic Tilt Do these steps 5-10 times in a row:  Lie on your back on a firm bed or the floor with your legs stretched out.  Bend your knees so they point up to the ceiling. Your feet should be flat on the floor.  Tighten your lower belly (abdomen) muscles to press your lower back against the floor. This will make your tailbone point up to the ceiling instead of pointing down to your feet or the floor.  Stay in this position for 5-10 seconds while you gently tighten your muscles and breathe evenly. Cat-Cow Do these steps until your lower back  bends more easily:  Get on your hands and knees on a firm surface. Keep your hands under your shoulders, and keep your knees under your hips. You may put padding under your knees.  Let your head hang down, and make your tailbone point down to the floor so your lower back is round like the back of a cat.  Stay in this position for 5 seconds.  Slowly lift your head and make your tailbone point up to the ceiling so your back hangs low (sags) like the back of a cow.  Stay in this position for 5 seconds. Press-Ups Do these steps 5-10 times in a row: 1. Lie on your belly (face-down) on the floor. 2. Place your hands near your head, about shoulder-width apart. 3. While you keep your back relaxed and keep your hips on the floor, slowly straighten your arms to raise the top half of your body and lift your shoulders. Do not use your back muscles. To make yourself more comfortable, you may change where you place your hands. 4. Stay in this position for 5 seconds. 5. Slowly return to lying flat on the floor. Bridges Do these steps 10 times in a row: 1. Lie on your back on a firm surface. 2. Bend your knees so they point up to the ceiling. Your feet should be flat on the floor. 3. Tighten your butt muscles and  lift your butt off of the floor until your waist is almost as high as your knees. If you do not feel the muscles working in your butt and the back of your thighs, slide your feet 1-2 inches farther away from your butt. 4. Stay in this position for 3-5 seconds. 5. Slowly lower your butt to the floor, and let your butt muscles relax. If this exercise is too easy, try doing it with your arms crossed over your chest. Belly Crunches Do these steps 5-10 times in a row: 1. Lie on your back on a firm bed or the floor with your legs stretched out. 2. Bend your knees so they point up to the ceiling. Your feet should be flat on the floor. 3. Cross your arms over your chest. 4. Tip your chin a little bit  toward your chest but do not bend your neck. 5. Tighten your belly muscles and slowly raise your chest just enough to lift your shoulder blades a tiny bit off of the floor. 6. Slowly lower your chest and your head to the floor. Back Lifts Do these steps 5-10 times in a row: 1. Lie on your belly (face-down) with your arms at your sides, and rest your forehead on the floor. 2. Tighten the muscles in your legs and your butt. 3. Slowly lift your chest off of the floor while you keep your hips on the floor. Keep the back of your head in line with the curve in your back. Look at the floor while you do this. 4. Stay in this position for 3-5 seconds. 5. Slowly lower your chest and your face to the floor. GET HELP IF:  Your back pain gets a lot worse when you do an exercise.  Your back pain does not lessen 2 hours after you exercise. If you have any of these problems, stop doing the exercises. Do not do them again unless your doctor says it is okay. GET HELP RIGHT AWAY IF:  You have sudden, very bad back pain. If this happens, stop doing the exercises. Do not do them again unless your doctor says it is okay.   This information is not intended to replace advice given to you by your health care provider. Make sure you discuss any questions you have with your health care provider.   Document Released: 09/24/2010 Document Revised: 05/13/2015 Document Reviewed: 10/16/2014 Elsevier Interactive Patient Education 2016 [employer] Inc[employer] Back Pain, Adult Back pain is very common. The pain often gets better over time. The cause of back pain is usually not dangerous. Most people can learn to manage their back pain on their own.  HOME CARE  Watch your back pain for any changes. The following actions may help to lessen any pain you are feeling:  Stay active. Start with short walks on flat ground if you can. Try to walk farther each day.  Exercise regularly as told by your doctor. Exercise helps your back heal  faster. It also helps avoid future injury by keeping your muscles strong and flexible.  Do not sit, drive, or stand in one place for more than 30 minutes.  Do not stay in bed. Resting more than 1-2 days can slow down your recovery.  Be careful when you bend or lift an object. Use good form when lifting:  Bend at your knees.  Keep the object close to your body.  Do not twist.  Sleep on a firm mattress. Lie on your side, and bend your knees. If  you lie on your back, put a pillow under your knees.  Take medicines only as told by your doctor.  Put ice on the injured area.  Put ice in a plastic bag.  Place a towel between your skin and the bag.  Leave the ice on for 20 minutes, 2-3 times a day for the first 2-3 days. After that, you can switch between ice and heat packs.  Avoid feeling anxious or stressed. Find good ways to deal with stress, such as exercise.  Maintain a healthy weight. Extra weight puts stress on your back. GET HELP IF:   You have pain that does not go away with rest or medicine.  You have worsening pain that goes down into your legs or buttocks.  You have pain that does not get better in one week.  You have pain at night.  You lose weight.  You have a fever or chills. GET HELP RIGHT AWAY IF:   You cannot control when you poop (bowel movement) or pee (urinate).  Your arms or legs feel weak.  Your arms or legs lose feeling (numbness).  You feel sick to your stomach (nauseous) or throw up (vomit).  You have belly (abdominal) pain.  You feel like you may pass out (faint).   This information is not intended to replace advice given to you by your health care provider. Make sure you discuss any questions you have with your health care provider.   Document Released: 02/08/2008 Document Revised: 09/12/2014 Document Reviewed: 12/24/2013 Elsevier Interactive Patient Education [employer]

## 2016-01-13 NOTE — Progress Notes (Signed)
Patient is here for ED FU.  Patient complains of mid line back pain scaled currently at a 7. Pain is described as a constant dull pain with intermittent sharp pains radiating to patients left side and down left leg.  Patient denies any suicidal ideations at this time.  Patient has not taken medication today.Patient has eaten today.

## 2016-01-26 ENCOUNTER — Ambulatory Visit: Payer: Medicaid Other | Attending: Internal Medicine | Admitting: Physical Therapy

## 2016-01-26 DIAGNOSIS — M545 Low back pain: Secondary | ICD-10-CM

## 2016-01-26 NOTE — Patient Instructions (Signed)
Look at videos on You Tube: Pilates for low back pain, lower abdominal progression, safe low back ab exercises  Bird Dog Pose - Intermediate    Keep pelvis stable. From table pose, step one leg back to plank pose. Reach opposite arm forward, back foot off floor.  Hold for _5 sec ___ breaths. Return arm and leg at same rate. Repeat __10__ times, alternating sides.  Copyright  VHI. All rights reserved.  BACK: Child's Pose (Sciatica)    Sit in knee-chest position and reach arms forward. Separate knees for comfort. Hold position for __3-5_ breaths. Repeat __2-3_ times. Do __2_ times per day.  Copyright  VHI. All rights reserved.     Angry Cat Stretch    Tuck chin and tighten stomach, arching back. Repeat _5-10___ times per set. Do __1__ sets per session. Do 2__ sessions per day.  http://orth.exer.us/119   Copyright  VHI. All rights reserved.    Posture Tips DO: - stand tall and erect - keep chin tucked in - keep head and shoulders in alignment - check posture regularly in mirror or large window - pull head back against headrest in car seat;  Change your position often.  Sit with lumbar support. DON'T: - slouch or slump while watching TV or reading - sit, stand or lie in one position  for too long;  Sitting is especially hard on the spine so if you sit at a desk/use the computer, then stand up often!   Copyright  VHI. All rights reserved.  Posture - Standing   Good posture is important. Avoid slouching and forward head thrust. Maintain curve in low back and align ears over shoul- ders, hips over ankles.  Pull your belly button in toward your back bone.   Copyright  VHI. All rights reserved.  Posture - Sitting   Sit upright, head facing forward. Try using a roll to support lower back. Keep shoulders relaxed, and avoid rounded back. Keep hips level with knees. Avoid crossing legs for long periods.   Copyright  VHI. All rights reserved.   Reducing  Load   Copyright  VHI. All rights reserved.  BODY MECHANICS Tips Good body mechanics are important during activities of daily living. The practice of good body mechanics will: -help distribute weight throughout the skeleton in a more anatomically correct manner thus stimulating more normal forces on the bones, and encouraging stronger, healthier, denser bones. -reduce unnatural forces on bones, ligaments, joints and muscles and reduce risk of fracture, other injury or back pain. A WORD ON BODY POSITIONING: Sitting is the hardest position for the back. Lying on the back is the easiest. Standing, in good body alignment, is somewhere between. A good motto is: Sit less, stand more, and, when you can't do that, lie down on your back and exercise to strengthen it.  Copyright  VHI. All rights reserved.       Supine to Sit (Active)   Lie on back, left leg bent. Roll to other side. From side-lying, sit up on side of bed. Complete ___ sets of ___ repetitions. Perform ___ sessions per day.  Copyright  VHI. All rights reserved.    Housework - Reaching Down   If you are unable to bend your knees or squat, use a lazy Darl PikesSusan to keep items within easy reach. Store only light, unbreakable items on the lowest shelves, and use a reacher to pick them up.  Copyright  VHI. All rights reserved.  Low Shelf   Squat down, and bring  item close to lift.   Copyright  VHI. All rights reserved.  Lifting Principles .Maintain proper posture and head alignment. .Slide object as close as possible before lifting. .Move obstacles out of the way. .Test before lifting; ask for help if too heavy. .Tighten stomach muscles without holding breath. .Use smooth movements; do not jerk. .Use legs to do the work, and pivot with feet. .Distribute the work load symmetrically and close to the center of trunk. .Push instead of pull whenever possible.  Copyright  VHI. All rights reserved.  Posture -  Standing   Good posture is important. Avoid slouching and forward head thrust. Maintain curve in low back and align ears over shoul- ders, hips over ankles.   Copyright  VHI. All rights reserved.   Posture - Sitting   Sit upright, head facing forward. Try using a roll to support lower back. Keep shoulders relaxed, and avoid rounded back. Keep hips level with knees. Avoid crossing legs for long periods.   Copyright  VHI. All rights reserved.  Ideal Posture Use with figures on 3 (2 of 2): 1.Head erect 2.Chin in 3.Chest and navel aligned 4.Spinal curves maintained 5.Knees relaxed 6.Shoulders and hips aligned 7.Feet slightly apart 8.Toes and arches active 9.Abdomen taut (breathe with diaphragm) 10.Arms at sides Ideal posture is: -pain free. -achieved with practice, mindful interest, and body awareness.  Copyright  VHI. All rights reserved.     Move heavy items one at a time, or move portions of the contents.   Posture Awareness     Stand and check posture: Jut chin, pull back to comfortable position. Tilt pelvis forward, back; be sure back is not swayed. Roll from heels to balls of feet, then distribute your weight evenly. Picture a line through spine pulling you erect. Focus on breathing. Good Posture = Better Breathing. Check ____ times per day.  http://gt2.exer.us/873   Copyright  VHI. All rights reserved.

## 2016-01-26 NOTE — Therapy (Addendum)
[facility] [address] [place], [place], [address] Phone: [phone]   Fax:  [phone]  Physical Therapy Evaluation and Discharge  Patient Details  Name: Nancy [name] MRN: [id number] Date of Birth: 09/12/1990 Referring Provider: Rene PaciLeschber, Valerie   Encounter Date: 01/26/2016      PT End of Session - 01/26/16 1621    Visit Number 1   Number of Visits 1   PT Start Time 1545   PT Stop Time 1630   PT Time Calculation (min) 45 min   Activity Tolerance Patient tolerated treatment well   Behavior During Therapy [facility] for tasks assessed/performed      Past Medical History  Diagnosis Date  . Medical history non-contributory     Past Surgical History  Procedure Laterality Date  . Cesarean section    . Cesarean section N/A 11/11/2013    Procedure: CESAREAN SECTION With Bilateral Tubal Ligation;  Surgeon: Mitchel HonourMegan Morris, DO;  Location: [facility];  Service: Obstetrics;  Laterality: N/A;    There were no vitals filed for this visit.       Subjective Assessment - 01/26/16 1547    Subjective Pt had onset of pain as she tried to workout (reports at home).  After about a week she had increase in back pain.  Went to ED and followed up with MD.  She has difficulty interacting with her kids, bending, lifting children.  She endorses mild LLE weakness and had some sensory symptoms when her nerve pain was really flared up.  She has since improved to a degree with just some low back pain.     Limitations Lifting;Standing;Walking;House hold activities;Other (comment);Sitting  playing with kids    Diagnostic tests none    Patient Stated Goals learn new exercises and have less pain   Currently in Pain? Yes   Pain Score 4    Pain Location Back   Pain Orientation Lower;Right;Left  L>R   Pain Descriptors / Indicators Aching  was burning    Pain Type Chronic pain   Pain Onset More than a month ago   Aggravating Factors  being active     Pain Relieving Factors lying down    Effect of Pain on Daily Activities hard to be active and hang out with her kids.    Multiple Pain Sites No            [facility] PT Assessment - 01/26/16 1552    Assessment   Medical Diagnosis low back pain    Referring Provider Rene PaciLeschber, Valerie    Onset Date/Surgical Date 11/29/15   Next MD Visit unknown   Prior Therapy No    Precautions   Precautions None   Restrictions   Weight Bearing Restrictions No   Balance Screen   Has the patient fallen in the past 6 months No   Home Environment   Living Environment Private residence   Prior Function   Level of Independence Independent   Cognition   Overall Cognitive Status Within Functional Limits for tasks assessed   Sensation   Light Touch Appears Intact   Coordination   Gross Motor Movements are Fluid and Coordinated Not tested   Posture/Postural Control   Posture/Postural Control Postural limitations   Postural Limitations Increased lumbar lordosis;Anterior pelvic tilt   AROM   Lumbar Flexion fingers to floor   min pain    Lumbar Extension WFL min pain    Lumbar - Right Side Bend WNL   Lumbar - Left Side  Bend WNL   Lumbar - Right Rotation min stiff   Lumbar - Left Rotation min stiff    Strength   Strength Assessment Site --  WNL    Palpation   Spinal mobility good, normal mobility    Palpation comment not oainful or tender                   [facility] Adult PT Treatment/Exercise - 01/26/16 1552    Self-Care   Self-Care ADL's;Lifting;Posture;Other Self-Care Comments   Other Self-Care Comments  HEP, see education    Lumbar Exercises: Stretches   Single Knee to Chest Stretch 2 reps;30 seconds   Lumbar Exercises: Supine   Ab Set 5 reps   AB Set Limitations hold tabletop isometric   Bent Knee Raise 10 reps   Lumbar Exercises: Quadruped   Madcat/Old Horse 5 reps   Opposite Arm/Leg Raise Right arm/Left leg;Left arm/Right leg;5 reps   Other Quadruped Lumbar Exercises childs  pose                PT Education - 01/26/16 1621    Education provided Yes   Education Details HEP, safe ab exercises and squats, form , neutral spine    Person(s) Educated Patient   Methods Explanation;Demonstration;Handout   Comprehension Verbalized understanding;Returned demonstration          PT Short Term Goals - 01/26/16 1632    PT SHORT TERM GOAL #1   Title Pt will be given info on posture, body mechanics, lifting and HEP    Time 1   Period Weeks   Status Achieved                  Plan - 01/26/16 1622    Clinical Impression Statement Patient with low complexity eval for mechanical low back pain which may have been brought on by abdominal exercises that were too challenging.  She tends to hyperextend her lumbar spine with hip flexion.  Radicular symptoms have resolved almost completely.  She has no insurance coverage for this diagnosis under [facility].     Rehab Potential Excellent   PT Frequency One time visit   PT Treatment/Interventions Patient/family education;Therapeutic exercise   PT Next Visit Plan NA   PT Home Exercise Plan Core, scissors, lower abd   Consulted and Agree with Plan of Care Patient      Patient will benefit from skilled therapeutic intervention in order to improve the following deficits and impairments:  Pain, Decreased strength, Postural dysfunction  Visit Diagnosis: Midline low back pain, with sciatica presence unspecified     Problem List Patient Active Problem List   Diagnosis Date Noted  . Low back pain 01/13/2016    Nancy [name] 01/26/2016, 4:35 PM  [facility] [address] [place], [place], [address] Phone: [phone]   Fax:  [phone]  Name: Nancy [name] MRN: [id number] Date of Birth: [birth year]   Karie Mainland, PT 01/26/2016 4:35 PM Phone: [phone] Fax: [phone]  .

## 2016-08-23 ENCOUNTER — Ambulatory Visit (HOSPITAL_COMMUNITY)
Admission: EM | Admit: 2016-08-23 | Discharge: 2016-08-23 | Disposition: A | Discharge status: Home / Self Care | Payer: Managed Care, Other (non HMO) | Attending: Family Medicine | Admitting: Family Medicine

## 2016-08-23 ENCOUNTER — Encounter (HOSPITAL_COMMUNITY): Payer: Self-pay | Admitting: *Deleted

## 2016-08-23 DIAGNOSIS — J329 Chronic sinusitis, unspecified: Secondary | ICD-10-CM | POA: Diagnosis not present

## 2016-08-23 DIAGNOSIS — J3489 Other specified disorders of nose and nasal sinuses: Secondary | ICD-10-CM | POA: Diagnosis not present

## 2016-08-23 MED ORDER — PREDNISONE 20 MG PO TABS: ORAL_TABLET | ORAL | 0 refills | Status: DC

## 2016-08-23 NOTE — ED Triage Notes (Addendum)
Pt  Reports   Symptoms  Of  Nasal  Drainage   Sinus  Congestion    Headache     As   Well  As   Cough   And  Congestion        Symptoms     X   sev   Weeks        Pt     Is  Sitting  Upright on  The  Exam  Room     Speaking in  Complete    sentances

## 2016-08-23 NOTE — Discharge Instructions (Signed)
Sudafed PE 10 mg every 4 to 6 hours as needed for congestion Allegra or Zyrtec daily as needed for drainage and runny nose. For stronger antihistamine may take Chlor-Trimeton 2 to 4 mg every 4 to 6 hours, may cause drowsiness. Saline nasal spray used frequently. Ibuprofen 400 mg every 6 hours as needed for pain, discomfort or fever. Drink plenty of fluids and stay well-hydrated. If you develop persistent fevers with headache and facial pain after 7 days of treatment seek medical attention

## 2016-08-23 NOTE — ED Provider Notes (Signed)
CSN: [id number]     Arrival date & time 08/23/16  1631 History   First MD Initiated Contact with Patient 08/23/16 1719     Chief Complaint  Patient presents with  . Facial Pain   (Consider location/radiation/quality/duration/timing/severity/associated sxs/prior Treatment) 25 year old female complaining of green runny nasal discharge, runny nose, headache and forehead pain, PND, cough and ear charting for couple weeks. Denies fever, chills or in the lateral symptoms.      Past Medical History:  Diagnosis Date  . Medical history non-contributory    Past Surgical History:  Procedure Laterality Date  . CESAREAN SECTION    . CESAREAN SECTION N/A 11/11/2013   Procedure: CESAREAN SECTION With Bilateral Tubal Ligation;  Surgeon: Mitchel HonourMegan Morris, DO;  Location: [facility];  Service: Obstetrics;  Laterality: N/A;   History reviewed. No pertinent family history. Social History  Substance Use Topics  . Smoking status: Never Smoker  . Smokeless tobacco: Never Used  . Alcohol use No   OB History    Gravida Para Term Preterm AB Living   2 2 1 1  0 3   SAB TAB Ectopic Multiple Live Births   0 0 0 1 3     Review of Systems  Constitutional: Negative for activity change, appetite change, chills, fatigue and fever.  HENT: Positive for congestion, ear pain, postnasal drip, rhinorrhea and sinus pressure. Negative for facial swelling, sore throat and trouble swallowing.   Eyes: Negative.   Respiratory: Negative.   Cardiovascular: Negative.   Musculoskeletal: Negative for neck pain and neck stiffness.  Skin: Negative for pallor and rash.  Neurological: Negative.   All other systems reviewed and are negative.   Allergies  Patient has no known allergies.  Home Medications   Prior to Admission medications   Medication Sig Start Date End Date Taking? Authorizing Provider  acetaminophen (TYLENOL) 500 MG tablet Take 500 mg by mouth every 6 (six) hours as needed for moderate pain. Reported on  01/13/2016    Historical Provider, MD  meloxicam (MOBIC) 15 MG tablet Take 1 tablet (15 mg total) by mouth daily. 01/13/16   Newt LukesValerie A Leschber, MD  Multiple Vitamins-Minerals (MULTIVITAMIN WITH MINERALS) tablet Take 1 tablet by mouth daily.    Historical Provider, MD  predniSONE (DELTASONE) 20 MG tablet Take 2 tabs po daily for 6 days. Take with food. 08/23/16   Hayden Rasmussenavid Mira Balon, NP   Meds Ordered and Administered this Visit  Medications - No data to display  LMP 08/22/2016  No data found.   Physical Exam  Constitutional: She is oriented to person, place, and time. She appears well-developed and well-nourished. No distress.  HENT:  Head: Normocephalic and atraumatic.  Right Ear: External ear normal.  Left Ear: External ear normal.  Mouth/Throat: No oropharyngeal exudate.  Oropharynx with tense cobblestoning, minor erythema and clear mild PND. No exudates.  Bilateral TMs with minor retraction. No erythema or bulging. No facial swelling or erythema, no shiners.  Eyes: EOM are normal.  Neck: Normal range of motion. Neck supple.  Cardiovascular: Normal rate and regular rhythm.   No murmur heard. Pulmonary/Chest: Effort normal. No respiratory distress. She has no rales.  Musculoskeletal: Normal range of motion.  Lymphadenopathy:    She has no cervical adenopathy.  Neurological: She is alert and oriented to person, place, and time. No cranial nerve deficit.  Skin: Skin is warm and dry.  Psychiatric: She has a normal mood and affect.  Nursing note and vitals reviewed.   Urgent Care Course  Clinical Course     Procedures (including critical care time)  Labs Review Labs Reviewed - No data to display  Imaging Review No results found.   Visual Acuity Review  Right Eye Distance:   Left Eye Distance:   Bilateral Distance:    Right Eye Near:   Left Eye Near:    Bilateral Near:         MDM   1. Rhinosinusitis   2. Rhinorrhea    Sudafed PE 10 mg every 4 to 6 hours as  needed for congestion Allegra or Zyrtec daily as needed for drainage and runny nose. For stronger antihistamine may take Chlor-Trimeton 2 to 4 mg every 4 to 6 hours, may cause drowsiness. Saline nasal spray used frequently. Ibuprofen 400 mg every 6 hours as needed for pain, discomfort or fever. Drink plenty of fluids and stay well-hydrated. If you develop persistent fevers with headache and facial pain after 7 days of treatment seek medical attention Meds ordered this encounter  Medications  . predniSONE (DELTASONE) 20 MG tablet    Sig: Take 2 tabs po daily for 6 days. Take with food.    Dispense:  12 tablet    Refill:  0    Order Specific Question:   Supervising Provider    Answer:   Linna HoffKINDL, JAMES D [[id number]]       Hayden Rasmussenavid Maleeyah Mccaughey, NP 08/23/16 1752

## 2017-01-10 ENCOUNTER — Emergency Department (HOSPITAL_COMMUNITY)
Admission: EM | Admit: 2017-01-10 | Discharge: 2017-01-10 | Disposition: A | Discharge status: Home / Self Care | Payer: Medicaid Other | Attending: Emergency Medicine | Admitting: Emergency Medicine

## 2017-01-10 ENCOUNTER — Encounter (HOSPITAL_COMMUNITY): Payer: Self-pay

## 2017-01-10 DIAGNOSIS — R519 Headache, unspecified: Secondary | ICD-10-CM

## 2017-01-10 DIAGNOSIS — R51 Headache: Secondary | ICD-10-CM | POA: Diagnosis present

## 2017-01-10 DIAGNOSIS — R110 Nausea: Secondary | ICD-10-CM | POA: Insufficient documentation

## 2017-01-10 DIAGNOSIS — Z79899 Other long term (current) drug therapy: Secondary | ICD-10-CM | POA: Diagnosis not present

## 2017-01-10 LAB — POC URINE PREG, ED: PREG TEST UR: NEGATIVE

## 2017-01-10 LAB — URINALYSIS, ROUTINE W REFLEX MICROSCOPIC
Bilirubin Urine: NEGATIVE
Glucose, UA: NEGATIVE mg/dL
HGB URINE DIPSTICK: NEGATIVE
Ketones, ur: NEGATIVE mg/dL
LEUKOCYTES UA: NEGATIVE
Nitrite: NEGATIVE
PROTEIN: NEGATIVE mg/dL
Specific Gravity, Urine: 1.006 (ref 1.005–1.030)
pH: 7 (ref 5.0–8.0)

## 2017-01-10 MED ORDER — DEXAMETHASONE SODIUM PHOSPHATE 10 MG/ML IJ SOLN
10.0000 mg | Freq: Once | INTRAMUSCULAR | Status: AC
  Administered 2017-01-10: 10 mg via INTRAVENOUS
  Filled 2017-01-10: qty 1

## 2017-01-10 MED ORDER — PROCHLORPERAZINE MALEATE 10 MG PO TABS: 10.0000 mg | ORAL_TABLET | Freq: Two times a day (BID) | ORAL | 0 refills | Status: DC | PRN

## 2017-01-10 MED ORDER — SODIUM CHLORIDE 0.9 % IV BOLUS (SEPSIS)
1000.0000 mL | Freq: Once | INTRAVENOUS | Status: AC
Start: 2017-01-10 — End: 2017-01-10
  Administered 2017-01-10: 1000 mL via INTRAVENOUS

## 2017-01-10 MED ORDER — DIPHENHYDRAMINE HCL 50 MG/ML IJ SOLN
25.0000 mg | Freq: Once | INTRAMUSCULAR | Status: AC
  Administered 2017-01-10: 25 mg via INTRAVENOUS
  Filled 2017-01-10: qty 1

## 2017-01-10 MED ORDER — PROCHLORPERAZINE EDISYLATE 5 MG/ML IJ SOLN
10.0000 mg | Freq: Once | INTRAMUSCULAR | Status: AC
  Administered 2017-01-10: 10 mg via INTRAVENOUS
  Filled 2017-01-10: qty 2

## 2017-01-10 NOTE — ED Provider Notes (Signed)
[facility]-EMERGENCY DEPT Provider Note   CSN: [id number] Arrival date & time: 01/10/17  0904     History   Chief Complaint Chief Complaint  Patient presents with  . Headache    HPI Nancy [name] is a 26 y.o. female.  The history is provided by the patient and medical records.  Headache   This is a new problem. The current episode started more than 1 week ago. The problem occurs constantly. The problem has not changed since onset.The headache is associated with bright light. The pain is located in the bilateral and temporal region. The quality of the pain is described as dull. The pain is severe. Associated symptoms include nausea. Pertinent negatives include no anorexia, no fever, no malaise/fatigue, no chest pressure, no near-syncope, no palpitations, no syncope, no shortness of breath and no vomiting. She has tried NSAIDs for the symptoms. The treatment provided mild relief.    Past Medical History:  Diagnosis Date  . Medical history non-contributory     Patient Active Problem List   Diagnosis Date Noted  . Low back pain 01/13/2016    Past Surgical History:  Procedure Laterality Date  . CESAREAN SECTION    . CESAREAN SECTION N/A 11/11/2013   Procedure: CESAREAN SECTION With Bilateral Tubal Ligation;  Surgeon: Mitchel Honour, DO;  Location: [facility];  Service: Obstetrics;  Laterality: N/A;    OB History    Gravida Para Term Preterm AB Living   2 2 1 1  0 3   SAB TAB Ectopic Multiple Live Births   0 0 0 1 3       Home Medications    Prior to Admission medications   Medication Sig Start Date End Date Taking? Authorizing Provider  ferrous sulfate 325 (65 FE) MG tablet Take 325 mg by mouth daily with breakfast.   Yes [provider]  Multiple Vitamins-Minerals (MULTIVITAMIN WITH MINERALS) tablet Take 1 tablet by mouth daily.   Yes [provider]  naproxen (NAPROSYN) 500 MG tablet Take 500 mg by mouth 2 (two) times daily with a meal.   Yes [provider]  meloxicam (MOBIC) 15 MG tablet Take 1 tablet (15 mg total) by mouth daily. Patient not taking: Reported on 01/10/2017 01/13/16   [name], MD  predniSONE (DELTASONE) 20 MG tablet Take 2 tabs po daily for 6 days. Take with food. Patient not taking: Reported on 01/10/2017 08/23/16   [name], NP    Family History Family History  Problem Relation Age of Onset  . Family history unknown: Yes    Social History Social History  Substance Use Topics  . Smoking status: Never Smoker  . Smokeless tobacco: Never Used  . Alcohol use No     Allergies   Patient has no known allergies.   Review of Systems Review of Systems  Constitutional: Negative for activity change, chills, diaphoresis, fatigue, fever and malaise/fatigue.  HENT: Negative for congestion and rhinorrhea.   Eyes: Positive for photophobia. Negative for visual disturbance.  Respiratory: Negative for cough, chest tightness, shortness of breath and stridor.   Cardiovascular: Negative for chest pain, palpitations, leg swelling, syncope and near-syncope.  Gastrointestinal: Positive for nausea. Negative for abdominal distention, abdominal pain, anorexia, constipation, diarrhea and vomiting.  Genitourinary: Negative for difficulty urinating, dysuria, flank pain, frequency, hematuria, menstrual problem, pelvic pain, vaginal bleeding and vaginal discharge.  Musculoskeletal: Negative for back pain and neck pain.  Skin: Negative for rash and wound.  Neurological: Positive for headaches. Negative for  dizziness, seizures, syncope, weakness, light-headedness and numbness.  Psychiatric/Behavioral: Negative for agitation and confusion.  All other systems reviewed and are negative.    Physical Exam Updated Vital Signs BP 112/75   Pulse 65   Temp 98.3 F (36.8 C) (Oral)   Resp 16   Ht 5\' 1"  (1.549 m)   Wt 119 lb (54 kg)   LMP 01/03/2017   SpO2 100%   BMI 22.48 kg/m   Physical Exam  Constitutional: She is  oriented to person, place, and time. She appears well-developed and well-nourished. No distress.  HENT:  Head: Normocephalic and atraumatic.  Left Ear: External ear normal.  Mouth/Throat: Oropharynx is clear and moist. No oropharyngeal exudate.  Eyes: Conjunctivae and EOM are normal. Pupils are equal, round, and reactive to light.  Neck: Normal range of motion. Neck supple.  Cardiovascular: Normal rate, regular rhythm and intact distal pulses.   No murmur heard. Pulmonary/Chest: Effort normal and breath sounds normal. No stridor. No respiratory distress. She has no wheezes. She exhibits no tenderness.  Abdominal: Soft. There is no tenderness.  Musculoskeletal: She exhibits no edema or tenderness.  Neurological: She is alert and oriented to person, place, and time. She is not disoriented. She displays no tremor and normal reflexes. No cranial nerve deficit or sensory deficit. She exhibits normal muscle tone. She displays no seizure activity. Coordination and gait normal. GCS eye subscore is 4. GCS verbal subscore is 5. GCS motor subscore is 6.  Skin: Skin is warm and dry. Capillary refill takes less than 2 seconds. No rash noted. She is not diaphoretic. No erythema.  Psychiatric: She has a normal mood and affect.  Nursing note and vitals reviewed.    ED Treatments / Results  Labs (all labs ordered are listed, but only abnormal results are displayed) Labs Reviewed  URINALYSIS, ROUTINE W REFLEX MICROSCOPIC - Abnormal; Notable for the following:       Result Value   Color, Urine STRAW (*)    All other components within normal limits  POC URINE PREG, ED    EKG  EKG Interpretation None       Radiology No results found.  Procedures Procedures (including critical care time)  Medications Ordered in ED Medications  sodium chloride 0.9 % bolus 1,000 mL (0 mLs Intravenous Stopped 01/10/17 1628)  prochlorperazine (COMPAZINE) injection 10 mg (10 mg Intravenous Given 01/10/17 1157)    diphenhydrAMINE (BENADRYL) injection 25 mg (25 mg Intravenous Given 01/10/17 1157)  dexamethasone (DECADRON) injection 10 mg (10 mg Intravenous Given 01/10/17 1157)     Initial Impression / Assessment and Plan / ED Course  I have reviewed the triage vital signs and the nursing notes.  Pertinent labs & imaging results that were available during my care of the patient were reviewed by me and considered in my medical decision making (see chart for details).     Nancy [name] is a 26 y.o. female with no significant past medical history who presents with headache and nausea. Patient reports that for the last 9 days, She has had a headache in her bilateral temples and associated photophobia. Patient describes the pain as moderate to severe. He denies any recent had trauma. Denies any neurologic deficits. She does report some mild nausea but no vomiting. He denies fevers, chills, neck stiffness, neck pain. She denies any urinary symptoms, bowel symptoms such as constipation or diarrhea. She has only taken ibuprofen which is not help her symptoms. She reports a history of headaches but has  not had one in several years. She denies other medication use or medical problems.  On exam, patient has no focal neurologic deficits. Patients pupils are reactive and symmetric. Both around. Normal extraocular movement. Normal sensation, strength, and coordination of all extremities. No abdominal tenderness or chest tenderness. Lungs clear.  Given reassuring physical exam and description of symptoms, suspected migraine type headache. Patient given headache cocktail.  Asian reassessed after fluids, Decadron, Benadryl, and Compazine. Patient had near complete resolution of headache. Patient felt much better.  Patient expressed concern that her aunt was diagnosed with a brain aneurysm which caused headaches. She agrees that the symptoms her and had are not similar to hers today. Patient explained that with her  reassuring exam, duration of symptoms, and her great response to the medications, she is unlikely to have an aneurysmal type headache. That being said, shared decision-making conversation held and patient agreed not to obtain imaging today. Patient instead will follow up with her PCP for further headache management discussions and outpatient workup if needed. Patient agreed with plan of care and had no other questions or concerns.  Patient discharged in good condition with understanding of the plan of care as well as return precautions and with resolution of her presenting headache.   Final Clinical Impressions(s) / ED Diagnoses   Final diagnoses:  Bad headache  Nausea    New Prescriptions Discharge Medication List as of 01/10/2017  4:09 PM      Clinical Impression: 1. Bad headache   2. Nausea     Disposition: Discharge  Condition: Good  I have discussed the results, Dx and Tx plan with the pt(& family if present). He/she/they expressed understanding and agree(s) with the plan. Discharge instructions discussed at great length. Strict return precautions discussed and pt &/or family have verbalized understanding of the instructions. No further questions at time of discharge.    Discharge Medication List as of 01/10/2017  4:09 PM      Follow Up: [facility] [address] [place] [id number] [phone] Schedule an appointment as soon as possible for a visit    [facility] [address] [id number] [place] [id number] [phone]  If symptoms worsen     Tegeler, Canary Brimhristopher J, MD 01/10/17 2104

## 2017-01-10 NOTE — ED Triage Notes (Signed)
Patient c/o headache x 9 days in the bilateral temple area, forehead and face area. Patient states she has had intermittent nausea.

## 2017-01-10 NOTE — Discharge Instructions (Signed)
Please take the medicines as needed to help with your headache symptoms. We suspect a migraine. Please rest and stay hydrated. Please schedule a follow-up with a primary care physician for further management of her headaches. If symptoms change or worsen, please return to the nearest emergency department.

## 2017-01-10 NOTE — ED Notes (Signed)
Patient ambulated to bathroom under own power.  Patient states that she feels better.

## 2017-01-10 NOTE — ED Notes (Signed)
Patient is alert and oriented x3.  She was given DC instructions and follow up visit instructions.  Patient gave verbal understanding. She was DC ambulatory under her own power to home.  V/S stable.  He was not showing any signs of distress on DC 

## 2017-03-01 ENCOUNTER — Encounter (HOSPITAL_COMMUNITY): Payer: Self-pay

## 2017-03-01 ENCOUNTER — Emergency Department (HOSPITAL_COMMUNITY)
Admission: EM | Admit: 2017-03-01 | Discharge: 2017-03-01 | Disposition: A | Discharge status: Home / Self Care | Payer: Medicaid Other | Attending: Emergency Medicine | Admitting: Emergency Medicine

## 2017-03-01 ENCOUNTER — Emergency Department (HOSPITAL_COMMUNITY): Payer: Medicaid Other

## 2017-03-01 DIAGNOSIS — Z791 Long term (current) use of non-steroidal anti-inflammatories (NSAID): Secondary | ICD-10-CM | POA: Insufficient documentation

## 2017-03-01 DIAGNOSIS — K529 Noninfective gastroenteritis and colitis, unspecified: Secondary | ICD-10-CM | POA: Insufficient documentation

## 2017-03-01 DIAGNOSIS — Z79899 Other long term (current) drug therapy: Secondary | ICD-10-CM | POA: Insufficient documentation

## 2017-03-01 DIAGNOSIS — R109 Unspecified abdominal pain: Secondary | ICD-10-CM | POA: Diagnosis present

## 2017-03-01 LAB — URINALYSIS, ROUTINE W REFLEX MICROSCOPIC
Bilirubin Urine: NEGATIVE
GLUCOSE, UA: NEGATIVE mg/dL
Hgb urine dipstick: NEGATIVE
KETONES UR: NEGATIVE mg/dL
LEUKOCYTES UA: NEGATIVE
Nitrite: NEGATIVE
PH: 5 (ref 5.0–8.0)
Protein, ur: NEGATIVE mg/dL
Specific Gravity, Urine: 1.018 (ref 1.005–1.030)

## 2017-03-01 LAB — I-STAT BETA HCG BLOOD, ED (MC, WL, AP ONLY): I-stat hCG, quantitative: <5 m[IU]/mL (ref <5)

## 2017-03-01 LAB — COMPREHENSIVE METABOLIC PANEL
ALBUMIN: 4.7 g/dL (ref 3.5–5.0)
ALT: 21 U/L (ref 14–54)
AST: 29 U/L (ref 15–41)
Alkaline Phosphatase: 73 U/L (ref 38–126)
Anion gap: 8 (ref 5–15)
BUN: 6 mg/dL (ref 6–20)
CHLORIDE: 107 mmol/L (ref 101–111)
CO2: 25 mmol/L (ref 22–32)
Calcium: 10 mg/dL (ref 8.9–10.3)
Creatinine, Ser: 0.76 mg/dL (ref 0.44–1.00)
GFR calc Af Amer: >60 mL/min (ref >60)
GFR calc non Af Amer: >60 mL/min (ref >60)
GLUCOSE: 115 mg/dL — AB (ref 65–99)
POTASSIUM: 3.5 mmol/L (ref 3.5–5.1)
SODIUM: 140 mmol/L (ref 135–145)
Total Bilirubin: 0.4 mg/dL (ref 0.3–1.2)
Total Protein: 7.2 g/dL (ref 6.5–8.1)

## 2017-03-01 LAB — CBC
HEMATOCRIT: 42.8 % (ref 36.0–46.0)
Hemoglobin: 14.8 g/dL (ref 12.0–15.0)
MCH: 29.6 pg (ref 26.0–34.0)
MCHC: 34.6 g/dL (ref 30.0–36.0)
MCV: 85.6 fL (ref 78.0–100.0)
Platelets: 201 10*3/uL (ref 150–400)
RBC: 5 MIL/uL (ref 3.87–5.11)
RDW: 12 % (ref 11.5–15.5)
WBC: 10.9 10*3/uL — AB (ref 4.0–10.5)

## 2017-03-01 LAB — LIPASE, BLOOD: LIPASE: 30 U/L (ref 11–51)

## 2017-03-01 MED ORDER — PROMETHAZINE HCL 25 MG PO TABS: 25.0000 mg | ORAL_TABLET | Freq: Four times a day (QID) | ORAL | 0 refills | Status: DC | PRN

## 2017-03-01 MED ORDER — MORPHINE SULFATE (PF) 4 MG/ML IV SOLN
4.0000 mg | Freq: Once | INTRAVENOUS | Status: DC
  Filled 2017-03-01: qty 1

## 2017-03-01 MED ORDER — ONDANSETRON HCL 4 MG/2ML IJ SOLN
4.0000 mg | Freq: Once | INTRAMUSCULAR | Status: AC
  Administered 2017-03-01: 4 mg via INTRAVENOUS
  Filled 2017-03-01: qty 2

## 2017-03-01 MED ORDER — IOPAMIDOL (ISOVUE-300) INJECTION 61%
INTRAVENOUS | Status: AC
  Administered 2017-03-01: 100 mL
  Filled 2017-03-01: qty 100

## 2017-03-01 MED ORDER — SODIUM CHLORIDE 0.9 % IV BOLUS (SEPSIS)
1000.0000 mL | Freq: Once | INTRAVENOUS | Status: AC
  Administered 2017-03-01: 1000 mL via INTRAVENOUS

## 2017-03-01 MED ORDER — IOPAMIDOL (ISOVUE-300) INJECTION 61%
INTRAVENOUS | Status: AC
  Filled 2017-03-01: qty 30

## 2017-03-01 NOTE — ED Provider Notes (Signed)
[facility]-EMERGENCY DEPT Provider Note   CSN: [id number] Arrival date & time: 03/01/17  4098     History   Chief Complaint Chief Complaint  Patient presents with  . Abdominal Pain    HPI Nancy [name] is a 26 y.o. female.  HPI Patient presents to the emergency department with, pain that started this morning when she woke up.  She states that she also has had several episodes of diarrhea with vomiting.  States she still has nausea.  Patient states that nothing seems make the condition better, movement and palpation makes the pain worse.  Patient states she did not take any medications prior to arrival for her symptoms. The patient denies chest pain, shortness of breath, headache,blurred vision, neck pain, fever, cough, weakness, numbness, dizziness, anorexia, edema, rash, back pain, dysuria, hematemesis, bloody stool, near syncope, or syncope. Past Medical History:  Diagnosis Date  . Medical history non-contributory     Patient Active Problem List   Diagnosis Date Noted  . Low back pain 01/13/2016    Past Surgical History:  Procedure Laterality Date  . CESAREAN SECTION    . CESAREAN SECTION N/A 11/11/2013   Procedure: CESAREAN SECTION With Bilateral Tubal Ligation;  Surgeon: Mitchel Honour, DO;  Location: [facility];  Service: Obstetrics;  Laterality: N/A;    OB History    Gravida Para Term Preterm AB Living   2 2 1 1  0 3   SAB TAB Ectopic Multiple Live Births   0 0 0 1 3       Home Medications    Prior to Admission medications   Medication Sig Start Date End Date Taking? Authorizing Provider  amoxicillin (AMOXIL) 875 MG tablet Take 875 mg by mouth 2 (two) times daily.   Yes [provider]  bismuth subsalicylate (PEPTO BISMOL) 262 MG/15ML suspension Take 30 mLs by mouth every 6 (six) hours as needed for indigestion.   Yes [provider]  busPIRone (BUSPAR) 5 MG tablet Take 5 mg by mouth 2 (two) times daily as needed for anxiety. 02/22/17  Yes [provider]  ferrous sulfate 325 (65 FE) MG tablet Take 325 mg by mouth daily with breakfast.   Yes [provider]  ibuprofen (ADVIL,MOTRIN) 800 MG tablet Take 800 mg by mouth daily. 02/16/17  Yes [provider]  Multiple Vitamins-Minerals (MULTIVITAMIN WITH MINERALS) tablet Take 1 tablet by mouth daily.   Yes [provider]  meloxicam (MOBIC) 15 MG tablet Take 1 tablet (15 mg total) by mouth daily. Patient not taking: Reported on 01/10/2017 01/13/16   [name], MD  predniSONE (DELTASONE) 20 MG tablet Take 2 tabs po daily for 6 days. Take with food. Patient not taking: Reported on 01/10/2017 08/23/16   [name], NP  prochlorperazine (COMPAZINE) 10 MG tablet Take 1 tablet (10 mg total) by mouth 2 (two) times daily as needed for nausea or vomiting. Patient not taking: Reported on 03/01/2017 01/10/17   Tegeler, Canary Brim, MD    Family History Family History  Problem Relation Age of Onset  . Family history unknown: Yes    Social History Social History  Substance Use Topics  . Smoking status: Never Smoker  . Smokeless tobacco: Never Used  . Alcohol use No     Allergies   Patient has no known allergies.   Review of Systems Review of Systems All other systems negative except as documented in the HPI. All pertinent positives and negatives as reviewed in the HPI.  Physical Exam Updated Vital Signs BP 106/65   Pulse (!) 59   Temp 97.9 F (36.6 C) (Oral)   Resp 16   Ht 5\' 2"  (1.575 m)   Wt 52.2 kg (115 lb)   LMP 03/01/2017   SpO2 100%   BMI 21.03 kg/m   Physical Exam  Constitutional: She is oriented to person, place, and time. She appears well-developed and well-nourished. No distress.  HENT:  Head: Normocephalic and atraumatic.  Mouth/Throat: Oropharynx is clear and moist.  Eyes: Pupils are equal, round, and reactive to light.  Neck: Normal range of motion. Neck supple.  Cardiovascular: Normal rate, regular rhythm and normal  heart sounds.  Exam reveals no gallop and no friction rub.   No murmur heard. Pulmonary/Chest: Effort normal and breath sounds normal. No respiratory distress. She has no wheezes.  Abdominal: Soft. Bowel sounds are normal. She exhibits no distension and no mass. There is tenderness. There is no rebound and no guarding.  Neurological: She is alert and oriented to person, place, and time. She exhibits normal muscle tone. Coordination normal.  Skin: Skin is warm and dry. Capillary refill takes less than 2 seconds. No rash noted. No erythema.  Psychiatric: She has a normal mood and affect. Her behavior is normal.  Nursing note and vitals reviewed.    ED Treatments / Results  Labs (all labs ordered are listed, but only abnormal results are displayed) Labs Reviewed  COMPREHENSIVE METABOLIC PANEL - Abnormal; Notable for the following:       Result Value   Glucose, Bld 115 (*)    All other components within normal limits  CBC - Abnormal; Notable for the following:    WBC 10.9 (*)    All other components within normal limits  URINALYSIS, ROUTINE W REFLEX MICROSCOPIC - Abnormal; Notable for the following:    APPearance HAZY (*)    All other components within normal limits  LIPASE, BLOOD  I-STAT BETA HCG BLOOD, ED ([facility], [facility], [facility] ONLY)    EKG  EKG Interpretation None       Radiology Ct Abdomen Pelvis W Contrast  Result Date: 03/01/2017 CLINICAL DATA:  Lower abdominal pain with nausea, vomiting and diarrhea. EXAM: CT ABDOMEN AND PELVIS WITH CONTRAST TECHNIQUE: Multidetector CT imaging of the abdomen and pelvis was performed using the standard protocol following bolus administration of intravenous contrast. CONTRAST:  100mL ISOVUE-300 IOPAMIDOL (ISOVUE-300) INJECTION 61% COMPARISON:  02/09/2013 FINDINGS: Lower chest: No acute abnormality. Hepatobiliary: No focal liver abnormality is seen. No gallstones, gallbladder wall thickening, or biliary dilatation. Pancreas: Unremarkable. No pancreatic  ductal dilatation or surrounding inflammatory changes. Spleen: Normal in size without focal abnormality. Adrenals/Urinary Tract: Adrenal glands are unremarkable. Kidneys are normal, without renal calculi, focal lesion, or hydronephrosis. Bladder is unremarkable. Stomach/Bowel: Negative for bowel obstruction, significant dilatation, ileus, or free air. No fluid collection or abscess. Normal appendix demonstrated. Wall thickening of the colon extending from the cecum to the transverse colon. Remainder of the colon is decompressed. Appearance compatible with mild diffuse colitis. Wall thickening is most pronounced in the hepatic flexure of the colon in the right upper quadrant. Vascular/Lymphatic: No significant vascular findings are present. No enlarged abdominal or pelvic lymph nodes. Reproductive: Uterus and bilateral adnexa are unremarkable. Other: No abdominal wall hernia or abnormality. No abdominopelvic ascites. Musculoskeletal: No acute or significant osseous findings. IMPRESSION: Mild diffuse colonic wall thickening, most pronounced in the right colon and hepatic flexure compatible with acute colitis, usually infectious / inflammatory. No associated bowel obstruction,  perforation, fluid collection, or abscess. Electronically Signed   By: Judie Petit.  Shick M.D.   On: 03/01/2017 13:28    Procedures Procedures (including critical care time)  Medications Ordered in ED Medications  iopamidol (ISOVUE-300) 61 % injection (not administered)  morphine 4 MG/ML injection 4 mg (0 mg Intravenous Hold 03/01/17 1147)  sodium chloride 0.9 % bolus 1,000 mL (1,000 mLs Intravenous New Bag/Given 03/01/17 1040)  ondansetron (ZOFRAN) injection 4 mg (4 mg Intravenous Given 03/01/17 1040)  iopamidol (ISOVUE-300) 61 % injection (100 mLs  Contrast Given 03/01/17 1301)     Initial Impression / Assessment and Plan / ED Course  I have reviewed the triage vital signs and the nursing notes.  Pertinent labs & imaging results that were  available during my care of the patient were reviewed by me and considered in my medical decision making (see chart for details).     The patient most likely has a gastroenteritis.  I did advise her to follow-up with her primary care doctor, given antiemetics.  Told to increase her fluid intake, as the patient to return here for any worsening in her condition.  CT scan did not show mild colonic wall thickening.  At this point no antibiotics are given Final Clinical Impressions(s) / ED Diagnoses   Final diagnoses:  None    New Prescriptions New Prescriptions   No medications on file     Charlestine Night, Cordelia Poche 03/01/17 1626    Derwood Kaplan, MD 03/02/17 [phone]

## 2017-03-01 NOTE — ED Notes (Signed)
PT has had 4 episodes of diarrhea since arriving in room. One of the episodes resulted in incontinence in bed.

## 2017-03-01 NOTE — ED Notes (Signed)
PT states she would like to hold off on medication incase pain returns. She is comfortable at the moment.

## 2017-03-01 NOTE — ED Notes (Signed)
AVS reviewed with pt and verbalized understanding. No topaz pad to obtain signature

## 2017-03-01 NOTE — ED Triage Notes (Addendum)
Pt states she started having abdominal pain this morning around 0700. Pt reports emesis x 3 this morning. She reports multiple episodes of diarrhea and reports 2 episodes were bloody. She states she attempted to take pepto bismol for the pain but could not keep it down. PCP sent her here  Pt reports taking 800mg  ibuprofen daily for headaches

## 2017-03-01 NOTE — Discharge Instructions (Signed)
Return here as needed.  Slowly increase your fluid intake, rest as much as possible.  Follow-up with your primary care doctor

## 2017-03-02 ENCOUNTER — Encounter (HOSPITAL_COMMUNITY): Payer: Self-pay

## 2017-03-02 ENCOUNTER — Emergency Department (HOSPITAL_COMMUNITY)
Admission: EM | Admit: 2017-03-02 | Discharge: 2017-03-02 | Disposition: A | Discharge status: Home / Self Care | Payer: Medicaid Other | Attending: Emergency Medicine | Admitting: Emergency Medicine

## 2017-03-02 DIAGNOSIS — R1030 Lower abdominal pain, unspecified: Secondary | ICD-10-CM

## 2017-03-02 DIAGNOSIS — R197 Diarrhea, unspecified: Secondary | ICD-10-CM | POA: Diagnosis not present

## 2017-03-02 LAB — COMPREHENSIVE METABOLIC PANEL
ALK PHOS: 73 U/L (ref 38–126)
ALT: 19 U/L (ref 14–54)
ANION GAP: 7 (ref 5–15)
AST: 22 U/L (ref 15–41)
Albumin: 4.3 g/dL (ref 3.5–5.0)
BILIRUBIN TOTAL: 1 mg/dL (ref 0.3–1.2)
BUN: 8 mg/dL (ref 6–20)
CALCIUM: 9.7 mg/dL (ref 8.9–10.3)
CO2: 23 mmol/L (ref 22–32)
CREATININE: 0.78 mg/dL (ref 0.44–1.00)
Chloride: 109 mmol/L (ref 101–111)
GFR calc non Af Amer: >60 mL/min (ref >60)
GLUCOSE: 102 mg/dL — AB (ref 65–99)
Potassium: 3.4 mmol/L — ABNORMAL LOW (ref 3.5–5.1)
Sodium: 139 mmol/L (ref 135–145)
Total Protein: 7.2 g/dL (ref 6.5–8.1)

## 2017-03-02 LAB — URINALYSIS, ROUTINE W REFLEX MICROSCOPIC
Bacteria, UA: NONE SEEN
Bilirubin Urine: NEGATIVE
GLUCOSE, UA: NEGATIVE mg/dL
HGB URINE DIPSTICK: NEGATIVE
Ketones, ur: 80 mg/dL — AB
Leukocytes, UA: NEGATIVE
NITRITE: NEGATIVE
PH: 5 (ref 5.0–8.0)
PROTEIN: 30 mg/dL — AB
Specific Gravity, Urine: 1.029 (ref 1.005–1.030)

## 2017-03-02 LAB — CBC
HCT: 44 % (ref 36.0–46.0)
HEMOGLOBIN: 15.4 g/dL — AB (ref 12.0–15.0)
MCH: 29.6 pg (ref 26.0–34.0)
MCHC: 35 g/dL (ref 30.0–36.0)
MCV: 84.6 fL (ref 78.0–100.0)
PLATELETS: 256 10*3/uL (ref 150–400)
RBC: 5.2 MIL/uL — AB (ref 3.87–5.11)
RDW: 12 % (ref 11.5–15.5)
WBC: 16.1 10*3/uL — ABNORMAL HIGH (ref 4.0–10.5)

## 2017-03-02 LAB — LIPASE, BLOOD: Lipase: 28 U/L (ref 11–51)

## 2017-03-02 MED ORDER — SODIUM CHLORIDE 0.9 % IV BOLUS (SEPSIS)
1000.0000 mL | Freq: Once | INTRAVENOUS | Status: AC
  Administered 2017-03-02: 1000 mL via INTRAVENOUS

## 2017-03-02 MED ORDER — MORPHINE SULFATE (PF) 4 MG/ML IV SOLN
4.0000 mg | Freq: Once | INTRAVENOUS | Status: AC
  Administered 2017-03-02: 4 mg via INTRAVENOUS
  Filled 2017-03-02: qty 1

## 2017-03-02 MED ORDER — CIPROFLOXACIN HCL 500 MG PO TABS: 500.0000 mg | ORAL_TABLET | Freq: Two times a day (BID) | ORAL | 0 refills | Status: DC

## 2017-03-02 MED ORDER — METRONIDAZOLE 500 MG PO TABS: 500.0000 mg | ORAL_TABLET | Freq: Three times a day (TID) | ORAL | 0 refills | Status: AC

## 2017-03-02 MED ORDER — ONDANSETRON HCL 4 MG/2ML IJ SOLN
4.0000 mg | Freq: Once | INTRAMUSCULAR | Status: AC
  Administered 2017-03-02: 4 mg via INTRAVENOUS
  Filled 2017-03-02: qty 2

## 2017-03-02 NOTE — ED Notes (Signed)
Patient up to desk.  Asking for pain medication.  Offered ibuprofen, patient refused.

## 2017-03-02 NOTE — ED Triage Notes (Signed)
Per [facility], pt is coming from home with complaints of abdominal pain that started yesterday morning at 0600. Pt reports vomiting and GI bleeding with diarrhea. Pt was seen here yesterday, but has not improved. Denies urinary symptoms. Vitals per EMS: 109/75, 72 HR, 99% on RA.   Reports nothing to eat in 1.5 days. CBG 114

## 2017-03-02 NOTE — ED Notes (Signed)
Pt up to desk.  Stating she feels like she is going to pass out.  Vitals reassessed, and patient instructed not to stand without RN assistance.

## 2017-03-02 NOTE — Discharge Instructions (Signed)
Please read attached information regarding your condition. Please take both antibiotics as directed for 7 days. Continue Zofran as needed for nausea. Follow-up with PCP for further evaluation. Return to ED for worsening abdominal pain, fevers, increased blood in stool, lightheadedness, loss of consciousness, increased vomiting.
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
## 2017-03-03 ENCOUNTER — Emergency Department (HOSPITAL_COMMUNITY)
Admission: EM | Admit: 2017-03-03 | Discharge: 2017-03-03 | Discharge status: Against Medical Advice | Payer: Managed Care, Other (non HMO)

## 2017-03-03 NOTE — ED Notes (Signed)
Pt up to nurse first stating she was going to [facility] b/c she does not want to wait. Encouraged pt to stay.

## 2017-03-05 ENCOUNTER — Encounter (HOSPITAL_COMMUNITY): Payer: Self-pay | Admitting: *Deleted

## 2017-03-05 ENCOUNTER — Emergency Department (HOSPITAL_COMMUNITY)
Admission: EM | Admit: 2017-03-05 | Discharge: 2017-03-05 | Disposition: A | Discharge status: Home / Self Care | Payer: Medicaid Other | Attending: Emergency Medicine | Admitting: Emergency Medicine

## 2017-03-05 DIAGNOSIS — E876 Hypokalemia: Secondary | ICD-10-CM | POA: Insufficient documentation

## 2017-03-05 DIAGNOSIS — R109 Unspecified abdominal pain: Secondary | ICD-10-CM | POA: Diagnosis present

## 2017-03-05 DIAGNOSIS — Z791 Long term (current) use of non-steroidal anti-inflammatories (NSAID): Secondary | ICD-10-CM | POA: Diagnosis not present

## 2017-03-05 DIAGNOSIS — Z79899 Other long term (current) drug therapy: Secondary | ICD-10-CM | POA: Insufficient documentation

## 2017-03-05 DIAGNOSIS — R112 Nausea with vomiting, unspecified: Secondary | ICD-10-CM | POA: Insufficient documentation

## 2017-03-05 DIAGNOSIS — R1084 Generalized abdominal pain: Secondary | ICD-10-CM | POA: Diagnosis not present

## 2017-03-05 HISTORY — DX: Noninfective gastroenteritis and colitis, unspecified: K52.9

## 2017-03-05 LAB — COMPREHENSIVE METABOLIC PANEL
ALBUMIN: 3.6 g/dL (ref 3.5–5.0)
ALK PHOS: 111 U/L (ref 38–126)
ALT: 64 U/L — AB (ref 14–54)
ANION GAP: 9 (ref 5–15)
AST: 83 U/L — ABNORMAL HIGH (ref 15–41)
BUN: 8 mg/dL (ref 6–20)
CALCIUM: 8.8 mg/dL — AB (ref 8.9–10.3)
CHLORIDE: 105 mmol/L (ref 101–111)
CO2: 21 mmol/L — AB (ref 22–32)
CREATININE: 0.88 mg/dL (ref 0.44–1.00)
GFR calc Af Amer: >60 mL/min (ref >60)
GFR calc non Af Amer: >60 mL/min (ref >60)
GLUCOSE: 122 mg/dL — AB (ref 65–99)
Potassium: 2.9 mmol/L — ABNORMAL LOW (ref 3.5–5.1)
SODIUM: 135 mmol/L (ref 135–145)
Total Bilirubin: 0.8 mg/dL (ref 0.3–1.2)
Total Protein: 6.2 g/dL — ABNORMAL LOW (ref 6.5–8.1)

## 2017-03-05 LAB — CBC
HCT: 39.9 % (ref 36.0–46.0)
HEMOGLOBIN: 13.6 g/dL (ref 12.0–15.0)
MCH: 29.1 pg (ref 26.0–34.0)
MCHC: 34.1 g/dL (ref 30.0–36.0)
MCV: 85.3 fL (ref 78.0–100.0)
Platelets: 194 10*3/uL (ref 150–400)
RBC: 4.68 MIL/uL (ref 3.87–5.11)
RDW: 12.3 % (ref 11.5–15.5)
WBC: 6.1 10*3/uL (ref 4.0–10.5)

## 2017-03-05 LAB — URINALYSIS, ROUTINE W REFLEX MICROSCOPIC
Bacteria, UA: NONE SEEN
Bilirubin Urine: NEGATIVE
GLUCOSE, UA: NEGATIVE mg/dL
Hgb urine dipstick: NEGATIVE
KETONES UR: 20 mg/dL — AB
Nitrite: NEGATIVE
PROTEIN: NEGATIVE mg/dL
Specific Gravity, Urine: 1.026 (ref 1.005–1.030)
pH: 5 (ref 5.0–8.0)

## 2017-03-05 LAB — I-STAT CG4 LACTIC ACID, ED
LACTIC ACID, VENOUS: 0.88 mmol/L (ref 0.5–1.9)
Lactic Acid, Venous: 1.47 mmol/L (ref 0.5–1.9)

## 2017-03-05 LAB — I-STAT BETA HCG BLOOD, ED (MC, WL, AP ONLY)

## 2017-03-05 LAB — LIPASE, BLOOD: LIPASE: 25 U/L (ref 11–51)

## 2017-03-05 MED ORDER — METRONIDAZOLE 500 MG PO TABS
500.0000 mg | ORAL_TABLET | Freq: Once | ORAL | Status: AC
  Administered 2017-03-05: 500 mg via ORAL
  Filled 2017-03-05: qty 1

## 2017-03-05 MED ORDER — ONDANSETRON 4 MG PO TBDP
8.0000 mg | ORAL_TABLET | Freq: Once | ORAL | Status: AC
  Administered 2017-03-05: 8 mg via ORAL
  Filled 2017-03-05: qty 2

## 2017-03-05 MED ORDER — ONDANSETRON HCL 4 MG/2ML IJ SOLN
4.0000 mg | Freq: Once | INTRAMUSCULAR | Status: AC
  Administered 2017-03-05: 4 mg via INTRAVENOUS
  Filled 2017-03-05: qty 2

## 2017-03-05 MED ORDER — MORPHINE SULFATE (PF) 4 MG/ML IV SOLN
4.0000 mg | Freq: Once | INTRAVENOUS | Status: AC
  Administered 2017-03-05: 4 mg via INTRAVENOUS
  Filled 2017-03-05: qty 1

## 2017-03-05 MED ORDER — CIPROFLOXACIN HCL 500 MG PO TABS
500.0000 mg | ORAL_TABLET | Freq: Once | ORAL | Status: AC
  Administered 2017-03-05: 500 mg via ORAL
  Filled 2017-03-05: qty 1

## 2017-03-05 MED ORDER — SODIUM CHLORIDE 0.9 % IV BOLUS (SEPSIS)
2000.0000 mL | Freq: Once | INTRAVENOUS | Status: AC
  Administered 2017-03-05: 2000 mL via INTRAVENOUS

## 2017-03-05 MED ORDER — POTASSIUM CHLORIDE CRYS ER 20 MEQ PO TBCR
40.0000 meq | EXTENDED_RELEASE_TABLET | Freq: Once | ORAL | Status: AC
  Administered 2017-03-05: 40 meq via ORAL
  Filled 2017-03-05: qty 2

## 2017-03-05 NOTE — ED Notes (Addendum)
Pt states she feels dizzy and flushed along with the nausea, Dr Shela CommonsJ aware

## 2017-03-05 NOTE — ED Notes (Signed)
Pt. Called out saying she is in pain, RN notified

## 2017-03-05 NOTE — ED Triage Notes (Signed)
Pt reports recent diagnosis of colitis. Pt has been taking antibiotics but reports still having abd pain, vomiting, green emesis, green diarrhea, headache and fever.

## 2017-03-05 NOTE — ED Notes (Signed)
ED Provider at bedside. 

## 2017-03-05 NOTE — ED Notes (Signed)
Pt provided with turkey sandwich and ginger ale.

## 2017-03-05 NOTE — Discharge Instructions (Signed)
Stop Cipro and metronidazole(Flagyl). Make sure that you drink at least six 8 ounce glasses of water or Gatorade each day in order to stay well-hydrated. Take your nausea medicine (phenergan or Zofran) as needed for nausea. Return if you do not continue to improve within the next few days or see your primary care physician

## 2017-03-05 NOTE — ED Provider Notes (Signed)
[facility]PT Provider Note   CSN: [id number] Arrival date & time: 03/05/17  0859     History   Chief Complaint Chief Complaint  Patient presents with  . Abdominal Pain  . Emesis  . Fever    HPI Nancy [name] is a 26 y.o. female.Complains of abdominal pain, diffuse onset 4 days ago. Determined to have colitis on CT scan.. She presents today she continues to have vomiting. She had temperature of 102 yesterday. She vomited twice today, greenish material she admits to bloody stools until yesterday. Had greenish bowel movement today. Her abdominal pain is mild other associated symptoms include lightheadedness and headache. Treated with Cipro, metronidazole, Zofran, and another antiemetic which she does not recall. She is taking Tylenol for pain. No other associated symptoms  HPI  Past Medical History:  Diagnosis Date  . Colitis   . Medical history non-contributory     Patient Active Problem List   Diagnosis Date Noted  . Low back pain 01/13/2016    Past Surgical History:  Procedure Laterality Date  . CESAREAN SECTION    . CESAREAN SECTION N/A 11/11/2013   Procedure: CESAREAN SECTION With Bilateral Tubal Ligation;  Surgeon: Mitchel HonourMegan Morris, DO;  Location: [facility];  Service: Obstetrics;  Laterality: N/A;    OB History    Gravida Para Term Preterm AB Living   2 2 1 1  0 3   SAB TAB Ectopic Multiple Live Births   0 0 0 1 3       Home Medications    Prior to Admission medications   Medication Sig Start Date End Date Taking? Authorizing Provider  amoxicillin (AMOXIL) 875 MG tablet Take 875 mg by mouth 2 (two) times daily.    [provider]  bismuth subsalicylate (PEPTO BISMOL) 262 MG/15ML suspension Take 30 mLs by mouth every 6 (six) hours as needed for indigestion.    [provider]  busPIRone (BUSPAR) 5 MG tablet Take 5 mg by mouth 2 (two) times daily as needed for anxiety. 02/22/17   [provider]  ciprofloxacin (CIPRO) 500 MG  tablet Take 1 tablet (500 mg total) by mouth 2 (two) times daily. 03/02/17 03/09/17  Khatri, Hina, PA-C  ferrous sulfate 325 (65 FE) MG tablet Take 325 mg by mouth daily with breakfast.    [provider]  ibuprofen (ADVIL,MOTRIN) 800 MG tablet Take 800 mg by mouth daily. 02/16/17   [provider]  meloxicam (MOBIC) 15 MG tablet Take 1 tablet (15 mg total) by mouth daily. Patient not taking: Reported on 01/10/2017 01/13/16   Newt LukesLeschber, Valerie A, MD  metroNIDAZOLE (FLAGYL) 500 MG tablet Take 1 tablet (500 mg total) by mouth 3 (three) times daily. 03/02/17 03/09/17  Khatri, Hillary BowHina, PA-C  Multiple Vitamins-Minerals (MULTIVITAMIN WITH MINERALS) tablet Take 1 tablet by mouth daily.    [provider]  predniSONE (DELTASONE) 20 MG tablet Take 2 tabs po daily for 6 days. Take with food. Patient not taking: Reported on 01/10/2017 08/23/16   Hayden RasmussenMabe, David, NP  prochlorperazine (COMPAZINE) 10 MG tablet Take 1 tablet (10 mg total) by mouth 2 (two) times daily as needed for nausea or vomiting. Patient not taking: Reported on 03/01/2017 01/10/17   Tegeler, Canary Brimhristopher J, MD  promethazine (PHENERGAN) 25 MG tablet Take 1 tablet (25 mg total) by mouth every 6 (six) hours as needed for nausea or vomiting. 03/01/17   Charlestine NightLawyer, Christopher, PA-C    Family History Family History  Problem Relation Age of Onset  .  Family history unknown: Yes    Social History Social History  Substance Use Topics  . Smoking status: Never Smoker  . Smokeless tobacco: Never Used  . Alcohol use No     Allergies   Patient has no known allergies.   Review of Systems Review of Systems  Constitutional: Negative.   HENT: Negative.   Respiratory: Negative.   Cardiovascular: Negative.   Gastrointestinal: Positive for abdominal pain, nausea and vomiting.  Musculoskeletal: Negative.   Skin: Negative.   Neurological: Positive for light-headedness and headaches.  Psychiatric/Behavioral: Negative.   All other systems  reviewed and are negative.    Physical Exam Updated Vital Signs BP 97/79 (BP Location: Right Arm)   Pulse (!) 135   Temp 99.1 F (37.3 C) (Oral)   Resp 20   LMP 03/01/2017   SpO2 99%   Physical Exam  Constitutional: She appears well-developed and well-nourished. No distress.  HENT:  Head: Normocephalic and atraumatic.  Mucous membranes dry  Eyes: Conjunctivae are normal. Pupils are equal, round, and reactive to light.  Neck: Neck supple. No tracheal deviation present. No thyromegaly present.  Cardiovascular: Normal rate, regular rhythm and normal heart sounds.   No murmur heard. Pulse counted at 96 bpm by me  Pulmonary/Chest: Effort normal and breath sounds normal.  Abdominal: Soft. Bowel sounds are normal. She exhibits no distension and no mass. There is tenderness. There is no rebound and no guarding. No hernia.  Mild diffuse tenderness  Musculoskeletal: Normal range of motion. She exhibits no edema or tenderness.  Neurological: She is alert. Coordination normal.  Skin: Skin is warm and dry. No rash noted.  Psychiatric: She has a normal mood and affect.  Nursing note and vitals reviewed.    ED Treatments / Results  Labs (all labs ordered are listed, but only abnormal results are displayed) Labs Reviewed  LIPASE, BLOOD  COMPREHENSIVE METABOLIC PANEL  CBC  URINALYSIS, ROUTINE W REFLEX MICROSCOPIC  I-STAT CG4 LACTIC ACID, ED  I-STAT BETA HCG BLOOD, ED ([facility], [facility], [facility] ONLY)    EKG  EKG Interpretation None      Results for orders placed or performed during the hospital encounter of 03/05/17  Lipase, blood  Result Value Ref Range   Lipase 25 11 - 51 U/L  Comprehensive metabolic panel  Result Value Ref Range   Sodium 135 135 - 145 mmol/L   Potassium 2.9 (L) 3.5 - 5.1 mmol/L   Chloride 105 101 - 111 mmol/L   CO2 21 (L) 22 - 32 mmol/L   Glucose, Bld 122 (H) 65 - 99 mg/dL   BUN 8 6 - 20 mg/dL   Creatinine, Ser 1.61 0.44 - 1.00 mg/dL   Calcium 8.8 (L) 8.9 - 10.3  mg/dL   Total Protein 6.2 (L) 6.5 - 8.1 g/dL   Albumin 3.6 3.5 - 5.0 g/dL   AST 83 (H) 15 - 41 U/L   ALT 64 (H) 14 - 54 U/L   Alkaline Phosphatase 111 38 - 126 U/L   Total Bilirubin 0.8 0.3 - 1.2 mg/dL   GFR calc non Af Amer >60 >60 mL/min   GFR calc Af Amer >60 >60 mL/min   Anion gap 9 5 - 15  CBC  Result Value Ref Range   WBC 6.1 4.0 - 10.5 K/uL   RBC 4.68 3.87 - 5.11 MIL/uL   Hemoglobin 13.6 12.0 - 15.0 g/dL   HCT 09.6 04.5 - 40.9 %   MCV 85.3 78.0 - 100.0 fL  MCH 29.1 26.0 - 34.0 pg   MCHC 34.1 30.0 - 36.0 g/dL   RDW 16.1 09.6 - 04.5 %   Platelets 194 150 - 400 K/uL  Urinalysis, Routine w reflex microscopic  Result Value Ref Range   Color, Urine AMBER (A) YELLOW   APPearance CLEAR CLEAR   Specific Gravity, Urine 1.026 1.005 - 1.030   pH 5.0 5.0 - 8.0   Glucose, UA NEGATIVE NEGATIVE mg/dL   Hgb urine dipstick NEGATIVE NEGATIVE   Bilirubin Urine NEGATIVE NEGATIVE   Ketones, ur 20 (A) NEGATIVE mg/dL   Protein, ur NEGATIVE NEGATIVE mg/dL   Nitrite NEGATIVE NEGATIVE   Leukocytes, UA SMALL (A) NEGATIVE   RBC / HPF 0-5 0 - 5 RBC/hpf   WBC, UA 0-5 0 - 5 WBC/hpf   Bacteria, UA NONE SEEN NONE SEEN   Squamous Epithelial / LPF 0-5 (A) NONE SEEN   Mucous PRESENT   I-Stat CG4 Lactic Acid, ED  Result Value Ref Range   Lactic Acid, Venous 1.47 0.5 - 1.9 mmol/L  I-Stat Beta hCG blood, ED ([facility], [facility], [facility] only)  Result Value Ref Range   I-stat hCG, quantitative <5.0 <5 mIU/mL   Comment 3          I-Stat CG4 Lactic Acid, ED  Result Value Ref Range   Lactic Acid, Venous 0.88 0.5 - 1.9 mmol/L   Ct Abdomen Pelvis W Contrast  Result Date: 03/01/2017 CLINICAL DATA:  Lower abdominal pain with nausea, vomiting and diarrhea. EXAM: CT ABDOMEN AND PELVIS WITH CONTRAST TECHNIQUE: Multidetector CT imaging of the abdomen and pelvis was performed using the standard protocol following bolus administration of intravenous contrast. CONTRAST:  ISOVUE-300 IOPAMIDOL (ISOVUE-300) INJECTION 61%  COMPARISON:  02/09/2013 FINDINGS: Lower chest: No acute abnormality. Hepatobiliary: No focal liver abnormality is seen. No gallstones, gallbladder wall thickening, or biliary dilatation. Pancreas: Unremarkable. No pancreatic ductal dilatation or surrounding inflammatory changes. Spleen: Normal in size without focal abnormality. Adrenals/Urinary Tract: Adrenal glands are unremarkable. Kidneys are normal, without renal calculi, focal lesion, or hydronephrosis. Bladder is unremarkable. Stomach/Bowel: Negative for bowel obstruction, significant dilatation, ileus, or free air. No fluid collection or abscess. Normal appendix demonstrated. Wall thickening of the colon extending from the cecum to the transverse colon. Remainder of the colon is decompressed. Appearance compatible with mild diffuse colitis. Wall thickening is most pronounced in the hepatic flexure of the colon in the right upper quadrant. Vascular/Lymphatic: No significant vascular findings are present. No enlarged abdominal or pelvic lymph nodes. Reproductive: Uterus and bilateral adnexa are unremarkable. Other: No abdominal wall hernia or abnormality. No abdominopelvic ascites. Musculoskeletal: No acute or significant osseous findings. IMPRESSION: Mild diffuse colonic wall thickening, most pronounced in the right colon and hepatic flexure compatible with acute colitis, usually infectious / inflammatory. No associated bowel obstruction, perforation, fluid collection, or abscess. Electronically Signed   By: Judie Petit.  Shick M.D.   On: 03/01/2017 13:28    Radiology No results found.  Procedures Procedures (including critical care time)  Medications Ordered in ED Medications  sodium chloride 0.9 % bolus 2,000 mL (not administered)     Initial Impression / Assessment and Plan / ED Course  I have reviewed the triage vital signs and the nursing notes.  Pertinent labs & imaging results that were available during my care of the patient were reviewed by me  and considered in my medical decision making (see chart for details).     Declines antiemetic, declines pain medicine 10:55 AM requested antiemetic and analgesic.  IV morphine and Zofran ordered. She was also given 1 dose of her Cipro and Flagyl orally. Stomach became upset after having taken Flagyl. After lengthy discussion with the patient we decided jointly that she should come off of antibiotics as she feels that antibiotics are causing her GI upset. She received oral potassium supplementation while here At 3:40 PM she is alert she denies any abdominal pain denies nausea after treatment with Zofran. Plan stop Cipro, stop metronidazole. Encourage oral hydration. Referral back to PMD Final Clinical Impressions(s) / ED Diagnoses  Diagnoses #1 generalized abdominal pain #2 nausea and vomiting Final diagnoses:  None  #3 hypokalemia  New Prescriptions New Prescriptions   No medications on file     Doug Sou, MD 03/05/17 1544

## 2017-03-05 NOTE — ED Notes (Signed)
Crackers and ginger ale given to pt 

## 2017-05-05 ENCOUNTER — Emergency Department (HOSPITAL_COMMUNITY): Payer: Medicaid Other

## 2017-05-05 ENCOUNTER — Encounter (HOSPITAL_COMMUNITY): Payer: Self-pay | Admitting: *Deleted

## 2017-05-05 ENCOUNTER — Emergency Department (HOSPITAL_COMMUNITY)
Admission: EM | Admit: 2017-05-05 | Discharge: 2017-05-06 | Disposition: A | Discharge status: Home / Self Care | Payer: Medicaid Other | Attending: Emergency Medicine | Admitting: Emergency Medicine

## 2017-05-05 DIAGNOSIS — B349 Viral infection, unspecified: Secondary | ICD-10-CM | POA: Insufficient documentation

## 2017-05-05 DIAGNOSIS — N760 Acute vaginitis: Secondary | ICD-10-CM | POA: Insufficient documentation

## 2017-05-05 DIAGNOSIS — Z79899 Other long term (current) drug therapy: Secondary | ICD-10-CM | POA: Insufficient documentation

## 2017-05-05 DIAGNOSIS — B9689 Other specified bacterial agents as the cause of diseases classified elsewhere: Secondary | ICD-10-CM

## 2017-05-05 DIAGNOSIS — R509 Fever, unspecified: Secondary | ICD-10-CM | POA: Diagnosis present

## 2017-05-05 LAB — COMPREHENSIVE METABOLIC PANEL
ALK PHOS: 65 U/L (ref 38–126)
ALT: 13 U/L — ABNORMAL LOW (ref 14–54)
ANION GAP: 12 (ref 5–15)
AST: 25 U/L (ref 15–41)
Albumin: 4.3 g/dL (ref 3.5–5.0)
BUN: 11 mg/dL (ref 6–20)
CALCIUM: 10.3 mg/dL (ref 8.9–10.3)
CO2: 23 mmol/L (ref 22–32)
Chloride: 106 mmol/L (ref 101–111)
Creatinine, Ser: 0.88 mg/dL (ref 0.44–1.00)
GFR calc non Af Amer: >60 mL/min (ref >60)
Glucose, Bld: 121 mg/dL — ABNORMAL HIGH (ref 65–99)
Potassium: 3.6 mmol/L (ref 3.5–5.1)
SODIUM: 141 mmol/L (ref 135–145)
TOTAL PROTEIN: 7 g/dL (ref 6.5–8.1)
Total Bilirubin: 1.1 mg/dL (ref 0.3–1.2)

## 2017-05-05 LAB — I-STAT BETA HCG BLOOD, ED (MC, WL, AP ONLY): I-stat hCG, quantitative: <5 m[IU]/mL (ref <5)

## 2017-05-05 LAB — WET PREP, GENITAL
SPERM: NONE SEEN
Trich, Wet Prep: NONE SEEN
Yeast Wet Prep HPF POC: NONE SEEN

## 2017-05-05 LAB — CBC WITH DIFFERENTIAL/PLATELET
BASOS ABS: 0 10*3/uL (ref 0.0–0.1)
BASOS PCT: 0 %
Eosinophils Absolute: 0 10*3/uL (ref 0.0–0.7)
Eosinophils Relative: 0 %
HEMATOCRIT: 39.7 % (ref 36.0–46.0)
HEMOGLOBIN: 13.3 g/dL (ref 12.0–15.0)
Lymphocytes Relative: 4 %
Lymphs Abs: 0.6 10*3/uL — ABNORMAL LOW (ref 0.7–4.0)
MCH: 29.4 pg (ref 26.0–34.0)
MCHC: 33.5 g/dL (ref 30.0–36.0)
MCV: 87.6 fL (ref 78.0–100.0)
Monocytes Absolute: 0.5 10*3/uL (ref 0.1–1.0)
Monocytes Relative: 4 %
NEUTROS ABS: 12.9 10*3/uL — AB (ref 1.7–7.7)
NEUTROS PCT: 92 %
Platelets: 208 10*3/uL (ref 150–400)
RBC: 4.53 MIL/uL (ref 3.87–5.11)
RDW: 12.2 % (ref 11.5–15.5)
WBC: 14 10*3/uL — ABNORMAL HIGH (ref 4.0–10.5)

## 2017-05-05 LAB — I-STAT CG4 LACTIC ACID, ED: Lactic Acid, Venous: 1.6 mmol/L (ref 0.5–1.9)

## 2017-05-05 LAB — LIPASE, BLOOD: LIPASE: 34 U/L (ref 11–51)

## 2017-05-05 MED ORDER — SODIUM CHLORIDE 0.9 % IV BOLUS (SEPSIS)
1000.0000 mL | Freq: Once | INTRAVENOUS | Status: AC
  Administered 2017-05-05: 1000 mL via INTRAVENOUS

## 2017-05-05 MED ORDER — ONDANSETRON HCL 4 MG/2ML IJ SOLN
4.0000 mg | Freq: Once | INTRAMUSCULAR | Status: AC
Start: 2017-05-05 — End: 2017-05-05
  Administered 2017-05-05: 4 mg via INTRAVENOUS
  Filled 2017-05-05: qty 2

## 2017-05-05 MED ORDER — ACETAMINOPHEN 325 MG PO TABS
650.0000 mg | ORAL_TABLET | Freq: Once | ORAL | Status: AC
  Administered 2017-05-05: 650 mg via ORAL
  Filled 2017-05-05: qty 2

## 2017-05-05 MED ORDER — KETOROLAC TROMETHAMINE 30 MG/ML IJ SOLN
15.0000 mg | Freq: Once | INTRAMUSCULAR | Status: AC
  Administered 2017-05-05: 15 mg via INTRAVENOUS
  Filled 2017-05-05: qty 1

## 2017-05-05 NOTE — ED Triage Notes (Signed)
Pt reports recent diagnosis of UTI and started on antibiotics. Pt started it yesterday, this am had red eyes,  N/v, high fever. Went back to pcp and given IM injection of antibiotics but reports still having a fever and not feeling well.

## 2017-05-05 NOTE — ED Notes (Signed)
Patient transported to X-ray 

## 2017-05-05 NOTE — ED Provider Notes (Signed)
[facility]-EMERGENCY DEPT Provider Note   CSN: [id number] Arrival date & time: 05/05/17  1540     History   Chief Complaint Chief Complaint  Patient presents with  . Fever    HPI Nancy [name] is a 26 y.o. female.  HPI  26 year old female with a past history of colitis presents with fever. She states she's been having bilateral and low back pain along with lower abdominal pain and vaginal discharge for about one week. She has dysuria as well. No hematuria. She saw her PCP yesterday who performed a pelvic exam but stated the labs would not be back yesterday. She prophylactically put her on Flagyl. Patient states that she took her first dose today and almost immediately developed symptoms such as fever of 103, eye redness, and vomiting. She's been having nausea ever since whenever she tries to eat or drink. She states she had very similar symptoms when she was given Flagyl for the colitis. Went back to her PCP and was given an IM antibiotic for a possible urine infection but did not test the urine. Patient states she has been having cough and congestion for the last 3 days. No sore throat. Has had a headache today. No neck pain or stiffness.  Past Medical History:  Diagnosis Date  . Colitis   . Medical history non-contributory     Patient Active Problem List   Diagnosis Date Noted  . Low back pain 01/13/2016    Past Surgical History:  Procedure Laterality Date  . CESAREAN SECTION    . CESAREAN SECTION N/A 11/11/2013   Procedure: CESAREAN SECTION With Bilateral Tubal Ligation;  Surgeon: Mitchel Honour, DO;  Location: [facility];  Service: Obstetrics;  Laterality: N/A;    OB History    Gravida Para Term Preterm AB Living   2 2 1 1  0 3   SAB TAB Ectopic Multiple Live Births   0 0 0 1 3       Home Medications    Prior to Admission medications   Medication Sig Start Date End Date Taking? Authorizing Provider  busPIRone (BUSPAR) 10 MG tablet Take 10 mg by mouth 2 (two) times  daily as needed for anxiety. 05/02/17  Yes [provider]  ibuprofen (ADVIL,MOTRIN) 800 MG tablet Take 800 mg by mouth every 8 (eight) hours as needed for fever or mild pain.  02/16/17  Yes [provider]  Multiple Vitamins-Minerals (MULTIVITAMIN WITH MINERALS) tablet Take 1 tablet by mouth daily.   Yes [provider]  meloxicam (MOBIC) 15 MG tablet Take 1 tablet (15 mg total) by mouth daily. Patient not taking: Reported on 01/10/2017 01/13/16   [name], MD  predniSONE (DELTASONE) 20 MG tablet Take 2 tabs po daily for 6 days. Take with food. Patient not taking: Reported on 01/10/2017 08/23/16   [name], NP  prochlorperazine (COMPAZINE) 10 MG tablet Take 1 tablet (10 mg total) by mouth 2 (two) times daily as needed for nausea or vomiting. Patient not taking: Reported on 03/01/2017 01/10/17   Tegeler, Canary Brim, MD  promethazine (PHENERGAN) 25 MG tablet Take 1 tablet (25 mg total) by mouth every 6 (six) hours as needed for nausea or vomiting. Patient not taking: Reported on 03/05/2017 03/01/17   Charlestine Night, PA-C    Family History Family History  Problem Relation Age of Onset  . Family history unknown: Yes    Social History Social History  Substance Use Topics  . Smoking status: Never Smoker  . Smokeless  tobacco: Never Used  . Alcohol use No     Allergies   Flagyl [metronidazole] and Other   Review of Systems Review of Systems  Constitutional: Positive for fever.  HENT: Positive for congestion. Negative for sore throat.   Eyes: Positive for redness.  Respiratory: Positive for cough.   Gastrointestinal: Positive for abdominal pain, nausea and vomiting. Negative for diarrhea.  Genitourinary: Positive for dysuria and vaginal discharge. Negative for vaginal bleeding.  Musculoskeletal: Positive for back pain.  Neurological: Positive for headaches.  All other systems reviewed and are negative.    Physical Exam Updated Vital Signs BP  113/81   Pulse 97   Temp 98.9 F (37.2 C) (Oral)   Resp (!) 24   LMP 04/21/2017   SpO2 100%   Physical Exam  Constitutional: She is oriented to person, place, and time. She appears well-developed and well-nourished. No distress.  HENT:  Head: Normocephalic and atraumatic.  Right Ear: External ear normal.  Left Ear: External ear normal.  Nose: Nose normal.  Mouth/Throat: Oropharynx is clear and moist. No oropharyngeal exudate.  Eyes: Pupils are equal, round, and reactive to light. EOM are normal. Right eye exhibits no discharge. Left eye exhibits no discharge.  Mild bilateral conjunctival redness  Neck: Normal range of motion. Neck supple.  Cardiovascular: Regular rhythm and normal heart sounds.  Tachycardia present.   HR low 100s  Pulmonary/Chest: Effort normal and breath sounds normal.  Abdominal: Soft. There is tenderness (mild) in the epigastric area and suprapubic area. There is no CVA tenderness.  Genitourinary: Cervix exhibits no motion tenderness. Right adnexum displays no mass and no tenderness. Left adnexum displays no mass and no tenderness. No bleeding in the vagina. Vaginal discharge (mild, clear) found.  Neurological: She is alert and oriented to person, place, and time.  Skin: Skin is warm and dry. She is not diaphoretic.  Nursing note and vitals reviewed.    ED Treatments / Results  Labs (all labs ordered are listed, but only abnormal results are displayed) Labs Reviewed  WET PREP, GENITAL - Abnormal; Notable for the following:       Result Value   Clue Cells Wet Prep HPF POC PRESENT (*)    WBC, Wet Prep HPF POC FEW (*)    All other components within normal limits  COMPREHENSIVE METABOLIC PANEL - Abnormal; Notable for the following:    Glucose, Bld 121 (*)    ALT 13 (*)    All other components within normal limits  CBC WITH DIFFERENTIAL/PLATELET - Abnormal; Notable for the following:    WBC 14.0 (*)    Neutro Abs 12.9 (*)    Lymphs Abs 0.6 (*)    All  other components within normal limits  URINE CULTURE  LIPASE, BLOOD  URINALYSIS, ROUTINE W REFLEX MICROSCOPIC  I-STAT CG4 LACTIC ACID, ED  I-STAT BETA HCG BLOOD, ED ([facility], [facility], [facility] ONLY)  GC/CHLAMYDIA PROBE AMP ([place]) NOT AT [facility]    EKG  EKG Interpretation None       Radiology Dg Chest 2 View  Result Date: 05/05/2017 CLINICAL DATA:  Fever today. Recent diagnosis of urinary tract infection. EXAM: CHEST  2 VIEW COMPARISON:  04/07/2008 FINDINGS: The lungs are clear. The pulmonary vasculature is normal. Heart size is normal. Hilar and mediastinal contours are unremarkable. There is no pleural effusion. IMPRESSION: No active cardiopulmonary disease. Electronically Signed   By: Ellery Plunk M.D.   On: 05/05/2017 23:14    Procedures Procedures (including critical care time)  Medications Ordered in ED Medications  ibuprofen (ADVIL,MOTRIN) tablet 800 mg (not administered)  sodium chloride 0.9 % bolus 1,000 mL (0 mLs Intravenous Stopped 05/05/17 2338)  ondansetron (ZOFRAN) injection 4 mg (4 mg Intravenous Given 05/05/17 2249)  ketorolac (TORADOL) 30 MG/ML injection 15 mg (15 mg Intravenous Given 05/05/17 2249)  sodium chloride 0.9 % bolus 1,000 mL (1,000 mLs Intravenous New Bag/Given 05/05/17 2337)  acetaminophen (TYLENOL) tablet 650 mg (650 mg Oral Given 05/05/17 2337)     Initial Impression / Assessment and Plan / ED Course  I have reviewed the triage vital signs and the nursing notes.  Pertinent labs & imaging results that were available during my care of the patient were reviewed by me and considered in my medical decision making (see chart for details).     Patient's fever (afebrile here) is probably from UTI/pyelo. Waiting on patient to urinate. She has mild vaginal discharge but exam not c/w PID. No lateralizing pain. HA probably from fever and poor PO intake. Low suspicion for meningitis. Fever could also be URI given other symptoms. If BV seen on workup, will treat with  clinda. Waiting on urine. Overall appears well, no vomiting here. Likely d/c after urine. Care to Fayrene HelperBowie Tran, who will f/u urine results.  Final Clinical Impressions(s) / ED Diagnoses   Final diagnoses:  None    New Prescriptions New Prescriptions   No medications on file     Pricilla LovelessGoldston, Jayra Choyce, MD 05/06/17 [phone]

## 2017-05-05 NOTE — ED Notes (Signed)
Sent label to main lab to add on lipase 

## 2017-05-05 NOTE — ED Provider Notes (Signed)
Colitis several months ago.  Has vaginal discharge and dysuria last week.  Has fever.    Wait for UA and wet prep.  If BV, then treat with clinda.    Doubt PID.    1:02 AM Pt received IVF and pain medication in the ER.  Does not report significant improvement of sxs.  Still endorse headache and mild abdominal discomfort.  Her urine shows no signs of urinary tract infection. Wet prep shows evidence of clue cells and a few WBC. Since patient has vaginal discharge and dysuria, she will be treated for vascular vaginosis with clindamycin which is the second choice agent as she is allergic to Flagyl. She has normal lactic acid, pregnancy test is negative, her electrolytes panel are reassuring. Elevated white count of 14. Normal lipase, normal chest x-ray.  On exam patient does not have any nuchal rigidity to suggest meningitis. I did discuss option of LP to r/o meningitis, pt declined the procedure.  She denies photo/phonophobia.  Repeat abdominal exam without any significant abdominal discomfort. At this time patient discharged home with clindamycin, antinausea medication, recommend taking Tylenol or ibuprofen as needed for pain. Return precaution discussed. She will follow-up with her primary care provider for further care.  BP (!) 114/54   Pulse (!) 103   Temp 98.9 F (37.2 C) (Oral)   Resp (!) 21   LMP 04/21/2017   SpO2 100%   Results for orders placed or performed during the hospital encounter of 05/05/17  Wet prep, genital  Result Value Ref Range   Yeast Wet Prep HPF POC NONE SEEN NONE SEEN   Trich, Wet Prep NONE SEEN NONE SEEN   Clue Cells Wet Prep HPF POC PRESENT (A) NONE SEEN   WBC, Wet Prep HPF POC FEW (A) NONE SEEN   Sperm NONE SEEN   Comprehensive metabolic panel  Result Value Ref Range   Sodium 141 135 - 145 mmol/L   Potassium 3.6 3.5 - 5.1 mmol/L   Chloride 106 101 - 111 mmol/L   CO2 23 22 - 32 mmol/L   Glucose, Bld 121 (H) 65 - 99 mg/dL   BUN 11 6 - 20 mg/dL   Creatinine,  Ser 1.61 0.44 - 1.00 mg/dL   Calcium 09.6 8.9 - 04.5 mg/dL   Total Protein 7.0 6.5 - 8.1 g/dL   Albumin 4.3 3.5 - 5.0 g/dL   AST 25 15 - 41 U/L   ALT 13 (L) 14 - 54 U/L   Alkaline Phosphatase 65 38 - 126 U/L   Total Bilirubin 1.1 0.3 - 1.2 mg/dL   GFR calc non Af Amer >60 >60 mL/min   GFR calc Af Amer >60 >60 mL/min   Anion gap 12 5 - 15  CBC with Differential  Result Value Ref Range   WBC 14.0 (H) 4.0 - 10.5 K/uL   RBC 4.53 3.87 - 5.11 MIL/uL   Hemoglobin 13.3 12.0 - 15.0 g/dL   HCT 40.9 81.1 - 91.4 %   MCV 87.6 78.0 - 100.0 fL   MCH 29.4 26.0 - 34.0 pg   MCHC 33.5 30.0 - 36.0 g/dL   RDW 78.2 95.6 - 21.3 %   Platelets 208 150 - 400 K/uL   Neutrophils Relative % 92 %   Neutro Abs 12.9 (H) 1.7 - 7.7 K/uL   Lymphocytes Relative 4 %   Lymphs Abs 0.6 (L) 0.7 - 4.0 K/uL   Monocytes Relative 4 %   Monocytes Absolute 0.5 0.1 - 1.0 K/uL  Eosinophils Relative 0 %   Eosinophils Absolute 0.0 0.0 - 0.7 K/uL   Basophils Relative 0 %   Basophils Absolute 0.0 0.0 - 0.1 K/uL  Urinalysis, Routine w reflex microscopic  Result Value Ref Range   Color, Urine YELLOW YELLOW   APPearance CLEAR CLEAR   Specific Gravity, Urine 1.031 (H) 1.005 - 1.030   pH 5.0 5.0 - 8.0   Glucose, UA NEGATIVE NEGATIVE mg/dL   Hgb urine dipstick NEGATIVE NEGATIVE   Bilirubin Urine NEGATIVE NEGATIVE   Ketones, ur NEGATIVE NEGATIVE mg/dL   Protein, ur 30 (A) NEGATIVE mg/dL   Nitrite NEGATIVE NEGATIVE   Leukocytes, UA NEGATIVE NEGATIVE   RBC / HPF 0-5 0 - 5 RBC/hpf   WBC, UA 0-5 0 - 5 WBC/hpf   Bacteria, UA NONE SEEN NONE SEEN   Squamous Epithelial / LPF 0-5 (A) NONE SEEN   Mucus PRESENT   Lipase, blood  Result Value Ref Range   Lipase 34 11 - 51 U/L  I-Stat CG4 Lactic Acid, ED  Result Value Ref Range   Lactic Acid, Venous 1.60 0.5 - 1.9 mmol/L  I-Stat beta hCG blood, ED  Result Value Ref Range   I-stat hCG, quantitative <5.0 <5 mIU/mL   Comment 3           Dg Chest 2 View  Result Date:  05/05/2017 CLINICAL DATA:  Fever today. Recent diagnosis of urinary tract infection. EXAM: CHEST  2 VIEW COMPARISON:  04/07/2008 FINDINGS: The lungs are clear. The pulmonary vasculature is normal. Heart size is normal. Hilar and mediastinal contours are unremarkable. There is no pleural effusion. IMPRESSION: No active cardiopulmonary disease. Electronically Signed   By: Ellery Plunkaniel R Mitchell M.D.   On: 05/05/2017 23:14      Fayrene Helperran, Bernestine Holsapple, PA-C 05/06/17 0107    Fayrene Helperran, Rajat Staver, PA-C 05/06/17 [id number]    Pricilla LovelessGoldston, Scott, MD 05/16/17 [phone]

## 2017-05-06 LAB — URINALYSIS, ROUTINE W REFLEX MICROSCOPIC
Bacteria, UA: NONE SEEN
Bilirubin Urine: NEGATIVE
GLUCOSE, UA: NEGATIVE mg/dL
HGB URINE DIPSTICK: NEGATIVE
KETONES UR: NEGATIVE mg/dL
Leukocytes, UA: NEGATIVE
Nitrite: NEGATIVE
PROTEIN: 30 mg/dL — AB
Specific Gravity, Urine: 1.031 — ABNORMAL HIGH (ref 1.005–1.030)
pH: 5 (ref 5.0–8.0)

## 2017-05-06 MED ORDER — IBUPROFEN 800 MG PO TABS
800.0000 mg | ORAL_TABLET | Freq: Once | ORAL | Status: AC
  Administered 2017-05-06: 800 mg via ORAL
  Filled 2017-05-06: qty 1

## 2017-05-06 MED ORDER — PROMETHAZINE HCL 25 MG PO TABS: 25.0000 mg | ORAL_TABLET | Freq: Four times a day (QID) | ORAL | 0 refills | Status: DC | PRN

## 2017-05-06 MED ORDER — CLINDAMYCIN HCL 150 MG PO CAPS: 150.0000 mg | ORAL_CAPSULE | Freq: Four times a day (QID) | ORAL | 0 refills | Status: DC

## 2017-05-07 LAB — URINE CULTURE: Culture: NO GROWTH

## 2017-05-11 LAB — GC/CHLAMYDIA PROBE AMP (~~LOC~~) NOT AT ARMC
CHLAMYDIA, DNA PROBE: NEGATIVE
NEISSERIA GONORRHEA: NEGATIVE

## 2018-09-19 ENCOUNTER — Other Ambulatory Visit: Payer: Self-pay

## 2018-09-19 ENCOUNTER — Emergency Department (HOSPITAL_COMMUNITY)
Admission: EM | Admit: 2018-09-19 | Discharge: 2018-09-19 | Disposition: A | Discharge status: Home / Self Care | Payer: Medicaid Other | Attending: Emergency Medicine | Admitting: Emergency Medicine

## 2018-09-19 ENCOUNTER — Encounter (HOSPITAL_COMMUNITY): Payer: Self-pay | Admitting: *Deleted

## 2018-09-19 ENCOUNTER — Emergency Department (HOSPITAL_COMMUNITY): Payer: Medicaid Other

## 2018-09-19 DIAGNOSIS — Z79899 Other long term (current) drug therapy: Secondary | ICD-10-CM | POA: Diagnosis not present

## 2018-09-19 DIAGNOSIS — R300 Dysuria: Secondary | ICD-10-CM | POA: Diagnosis not present

## 2018-09-19 DIAGNOSIS — N3090 Cystitis, unspecified without hematuria: Secondary | ICD-10-CM | POA: Insufficient documentation

## 2018-09-19 DIAGNOSIS — R1031 Right lower quadrant pain: Secondary | ICD-10-CM | POA: Diagnosis present

## 2018-09-19 DIAGNOSIS — M545 Low back pain, unspecified: Secondary | ICD-10-CM

## 2018-09-19 LAB — BASIC METABOLIC PANEL
ANION GAP: 10 (ref 5–15)
BUN: 6 mg/dL (ref 6–20)
CO2: 26 mmol/L (ref 22–32)
Calcium: 10 mg/dL (ref 8.9–10.3)
Chloride: 104 mmol/L (ref 98–111)
Creatinine, Ser: 0.72 mg/dL (ref 0.44–1.00)
GFR calc Af Amer: >60 mL/min (ref >60)
GFR calc non Af Amer: >60 mL/min (ref >60)
Glucose, Bld: 98 mg/dL (ref 70–99)
POTASSIUM: 4.2 mmol/L (ref 3.5–5.1)
Sodium: 140 mmol/L (ref 135–145)

## 2018-09-19 LAB — CBC
HEMATOCRIT: 41.7 % (ref 36.0–46.0)
Hemoglobin: 13.1 g/dL (ref 12.0–15.0)
MCH: 28.2 pg (ref 26.0–34.0)
MCHC: 31.4 g/dL (ref 30.0–36.0)
MCV: 89.9 fL (ref 80.0–100.0)
NRBC: 0 % (ref 0.0–0.2)
Platelets: 216 10*3/uL (ref 150–400)
RBC: 4.64 MIL/uL (ref 3.87–5.11)
RDW: 11.7 % (ref 11.5–15.5)
WBC: 6 10*3/uL (ref 4.0–10.5)

## 2018-09-19 LAB — URINALYSIS, ROUTINE W REFLEX MICROSCOPIC
Bilirubin Urine: NEGATIVE
Glucose, UA: NEGATIVE mg/dL
Ketones, ur: NEGATIVE mg/dL
Leukocytes, UA: NEGATIVE
Nitrite: POSITIVE — AB
PH: 5 (ref 5.0–8.0)
PROTEIN: NEGATIVE mg/dL
SPECIFIC GRAVITY, URINE: 1.006 (ref 1.005–1.030)

## 2018-09-19 LAB — I-STAT BETA HCG BLOOD, ED (MC, WL, AP ONLY): I-stat hCG, quantitative: <5 m[IU]/mL (ref <5)

## 2018-09-19 MED ORDER — HYDROCODONE-ACETAMINOPHEN 5-325 MG PO TABS: 1.0000 | ORAL_TABLET | ORAL | 0 refills | Status: DC | PRN

## 2018-09-19 MED ORDER — KETOROLAC TROMETHAMINE 60 MG/2ML IM SOLN
30.0000 mg | Freq: Once | INTRAMUSCULAR | Status: AC
  Administered 2018-09-19: 30 mg via INTRAMUSCULAR

## 2018-09-19 MED ORDER — IBUPROFEN 600 MG PO TABS: 600.0000 mg | ORAL_TABLET | Freq: Four times a day (QID) | ORAL | 0 refills | Status: AC | PRN

## 2018-09-19 MED ORDER — ONDANSETRON HCL 4 MG/2ML IJ SOLN
4.0000 mg | Freq: Once | INTRAMUSCULAR | Status: DC
  Filled 2018-09-19: qty 2

## 2018-09-19 MED ORDER — SODIUM CHLORIDE 0.9 % IV BOLUS: 1000.0000 mL | Freq: Once | INTRAVENOUS | Status: DC

## 2018-09-19 MED ORDER — KETOROLAC TROMETHAMINE 30 MG/ML IJ SOLN
30.0000 mg | Freq: Once | INTRAMUSCULAR | Status: DC
  Filled 2018-09-19: qty 1

## 2018-09-19 NOTE — ED Provider Notes (Signed)
[facility] Provider [facility]   CSN: [id number] Arrival date & time: 09/19/18  0604     History   Chief Complaint Chief Complaint  Patient presents with  . Flank Pain    HPI [place] Dul[place] Fanny[place] [name] is a 28 y.o. female.  Pt presents to the ED today with right sided flank pain.  She has had the pain for several days along with dysuria.  She saw her PCP on 1/13 and was given a rx for macrobid and pyridium.  Her right side flank pain is worsening.  She denies f/c or n/v.     Past Medical History:  Diagnosis Date  . Colitis   . Medical history non-contributory     Patient Active Problem List   Diagnosis Date Noted  . Low back pain 01/13/2016    Past Surgical History:  Procedure Laterality Date  . CESAREAN SECTION    . CESAREAN SECTION N/A 11/11/2013   Procedure: CESAREAN SECTION With Bilateral Tubal Ligation;  Surgeon: Mitchel HonourMegan Morris, DO;  Location: [facility];  Service: Obstetrics;  Laterality: N/A;     OB History    Gravida  2   Para  2   Term  1   Preterm  1   AB  0   Living  3     SAB  0   TAB  0   Ectopic  0   Multiple  1   Live Births  3            Home Medications    Prior to Admission medications   Medication Sig Start Date End Date Taking? Authorizing Provider  busPIRone (BUSPAR) 10 MG tablet Take 10 mg by mouth 2 (two) times daily as needed for anxiety. 05/02/17   [provider]  clindamycin (CLEOCIN) 150 MG capsule Take 1 capsule (150 mg total) by mouth every 6 (six) hours. 05/06/17   Fayrene Helperran, Bowie, PA-C  HYDROcodone-acetaminophen (NORCO/VICODIN) 5-325 MG tablet Take 1 tablet by mouth every 4 (four) hours as needed. 09/19/18   Jacalyn LefevreHaviland, Willies Laviolette, MD  ibuprofen (ADVIL,MOTRIN) 600 MG tablet Take 1 tablet (600 mg total) by mouth every 6 (six) hours as needed. 09/19/18   Jacalyn LefevreHaviland, Anasia Agro, MD  meloxicam (MOBIC) 15 MG tablet Take 1 tablet (15 mg total) by mouth daily. Patient not taking: Reported on 01/10/2017  01/13/16   Newt LukesLeschber, Valerie A, MD  Multiple Vitamins-Minerals (MULTIVITAMIN WITH MINERALS) tablet Take 1 tablet by mouth daily.    [provider]  predniSONE (DELTASONE) 20 MG tablet Take 2 tabs po daily for 6 days. Take with food. Patient not taking: Reported on 01/10/2017 08/23/16   Hayden RasmussenMabe, David, NP  prochlorperazine (COMPAZINE) 10 MG tablet Take 1 tablet (10 mg total) by mouth 2 (two) times daily as needed for nausea or vomiting. Patient not taking: Reported on 03/01/2017 01/10/17   Tegeler, Canary Brimhristopher J, MD  promethazine (PHENERGAN) 25 MG tablet Take 1 tablet (25 mg total) by mouth every 6 (six) hours as needed for nausea or vomiting. 05/06/17   Fayrene Helperran, Bowie, PA-C    Family History Family History  Family history unknown: Yes    Social History Social History   Tobacco Use  . Smoking status: Never Smoker  . Smokeless tobacco: Never Used  Substance Use Topics  . Alcohol use: Yes  . Drug use: No     Allergies   Flagyl [metronidazole] and Other   Review of Systems Review of Systems  Genitourinary: Positive for dysuria.  Musculoskeletal: Positive for back pain.  All other systems reviewed and are negative.    Physical Exam Updated Vital Signs BP 98/76   Pulse (!) 51   Temp 98 F (36.7 C) (Oral)   Resp 18   Ht 5\' 2"  (1.575 m)   Wt 54.4 kg   LMP 09/07/2018   SpO2 97%   BMI 21.95 kg/m   Physical Exam Vitals signs and nursing note reviewed.  Constitutional:      Appearance: Normal appearance. She is normal weight.  HENT:     Head: Normocephalic and atraumatic.     Right Ear: External ear normal.     Left Ear: External ear normal.     Nose: Nose normal.     Mouth/Throat:     Mouth: Mucous membranes are moist.     Pharynx: Oropharynx is clear.  Eyes:     Extraocular Movements: Extraocular movements intact.     Conjunctiva/sclera: Conjunctivae normal.     Pupils: Pupils are equal, round, and reactive to light.  Neck:     Musculoskeletal: Normal range of  motion and neck supple.  Cardiovascular:     Rate and Rhythm: Regular rhythm. Bradycardia present.     Pulses: Normal pulses.     Heart sounds: Normal heart sounds.  Pulmonary:     Effort: Pulmonary effort is normal.     Breath sounds: Normal breath sounds.  Abdominal:     General: Abdomen is flat.     Tenderness: There is right CVA tenderness.  Musculoskeletal: Normal range of motion.  Skin:    General: Skin is warm and dry.     Capillary Refill: Capillary refill takes less than 2 seconds.  Neurological:     General: No focal deficit present.     Mental Status: She is alert and oriented to person, place, and time.  Psychiatric:        Mood and Affect: Mood normal.        Behavior: Behavior normal.      ED Treatments / Results  Labs (all labs ordered are listed, but only abnormal results are displayed) Labs Reviewed  URINALYSIS, ROUTINE W REFLEX MICROSCOPIC - Abnormal; Notable for the following components:      Result Value   Color, Urine AMBER (*)    Hgb urine dipstick SMALL (*)    Nitrite POSITIVE (*)    Bacteria, UA RARE (*)    All other components within normal limits  CBC  BASIC METABOLIC PANEL  I-STAT BETA HCG BLOOD, ED ([facility], [facility], [facility] ONLY)    EKG None  Radiology Ct Renal Stone Study  Result Date: 09/19/2018 CLINICAL DATA:  Right flank pain EXAM: CT ABDOMEN AND PELVIS WITHOUT CONTRAST TECHNIQUE: Multidetector CT imaging of the abdomen and pelvis was performed following the standard protocol without oral or IV contrast. COMPARISON:  March 01, 2017 FINDINGS: Lower chest: Lung bases are clear. Hepatobiliary: No focal liver lesions are apparent on this noncontrast enhanced study. There is no gallbladder wall thickening. There is no biliary duct dilatation. Pancreas: No pancreatic mass or inflammatory focus. Spleen: No splenic lesions are evident. Adrenals/Urinary Tract: Adrenals bilaterally appear unremarkable. Kidneys bilaterally show no evident mass or hydronephrosis on  either side. There is no appreciable renal or ureteral calculus on either side. Urinary bladder is midline with wall thickness within normal limits. Stomach/Bowel: There is moderate stool throughout the colon. There is no appreciable bowel wall or mesenteric thickening. There is no evident bowel obstruction. There is no  appreciable free air or portal venous air. Vascular/Lymphatic: There is no abdominal aortic aneurysm. No vascular lesions are evident on this noncontrast enhanced study. There is no adenopathy in the abdomen or pelvis. Reproductive: Uterus is anteverted. There is no appreciable pelvic mass. Other: Appendix region appears unremarkable. No right lower quadrant inflammatory change is demonstrable. There is no abscess or ascites in the abdomen or pelvis. Musculoskeletal: No blastic or lytic bone lesions. No intramuscular or abdominal wall lesions are demonstrable. IMPRESSION: 1.  No evident renal or ureteral calculus.  No hydronephrosis. 2. Appendix region appears unremarkable. No bowel obstruction. No abscess in the abdomen or pelvis. No abnormal fluid collections. Comment: A cause for patient's symptoms has not been established with this study. Electronically Signed   By: Bretta Bang III M.D.   On: 09/19/2018 08:06    Procedures Procedures (including critical care time)  Medications Ordered in ED Medications  ketorolac (TORADOL) injection 30 mg (30 mg Intramuscular Given 09/19/18 0814)     Initial Impression / Assessment and Plan / ED Course  I have reviewed the triage vital signs and the nursing notes.  Pertinent labs & imaging results that were available during my care of the patient were reviewed by me and considered in my medical decision making (see chart for details).    Pt does not have a kidney stone or pyelo.  LBP likely MSK.  The pt has only had 2 days of abx and still has a UTI.  She is instructed to complete her course of abx.  She is given a dose of toradol here and  will be d/c home with ibuprofen and lortab.  Return if worse.  F/u with pcp.  Final Clinical Impressions(s) / ED Diagnoses   Final diagnoses:  Cystitis  Acute right-sided low back pain without sciatica    ED Discharge Orders         Ordered    ibuprofen (ADVIL,MOTRIN) 600 MG tablet  Every 6 hours PRN     09/19/18 0818    HYDROcodone-acetaminophen (NORCO/VICODIN) 5-325 MG tablet  Every 4 hours PRN     09/19/18 0818           Jacalyn Lefevre, MD 09/19/18 [id number]

## 2018-09-19 NOTE — ED Notes (Signed)
Pt returns from ct  

## 2018-09-19 NOTE — Discharge Instructions (Signed)
Continue current antibiotics.

## 2018-09-19 NOTE — ED Triage Notes (Signed)
C/o right flank pain onset Sat, states she was seen by her PCP on Monday and given antibiotic for Uti, States her back is hurting worse.

## 2018-10-02 ENCOUNTER — Emergency Department (HOSPITAL_COMMUNITY)
Admission: EM | Admit: 2018-10-02 | Discharge: 2018-10-02 | Disposition: A | Discharge status: Home / Self Care | Payer: Medicaid Other | Attending: Emergency Medicine | Admitting: Emergency Medicine

## 2018-10-02 ENCOUNTER — Other Ambulatory Visit: Payer: Self-pay

## 2018-10-02 ENCOUNTER — Emergency Department (HOSPITAL_COMMUNITY): Payer: Medicaid Other

## 2018-10-02 DIAGNOSIS — Z79899 Other long term (current) drug therapy: Secondary | ICD-10-CM | POA: Insufficient documentation

## 2018-10-02 DIAGNOSIS — M791 Myalgia, unspecified site: Secondary | ICD-10-CM | POA: Insufficient documentation

## 2018-10-02 DIAGNOSIS — M545 Low back pain, unspecified: Secondary | ICD-10-CM

## 2018-10-02 DIAGNOSIS — R52 Pain, unspecified: Secondary | ICD-10-CM

## 2018-10-02 DIAGNOSIS — N3001 Acute cystitis with hematuria: Secondary | ICD-10-CM | POA: Diagnosis not present

## 2018-10-02 LAB — CBC WITH DIFFERENTIAL/PLATELET
ABS IMMATURE GRANULOCYTES: 0.05 10*3/uL (ref 0.00–0.07)
BASOS PCT: 0 %
Basophils Absolute: 0 10*3/uL (ref 0.0–0.1)
EOS ABS: 0.1 10*3/uL (ref 0.0–0.5)
Eosinophils Relative: 1 %
HCT: 38.3 % (ref 36.0–46.0)
Hemoglobin: 12.2 g/dL (ref 12.0–15.0)
IMMATURE GRANULOCYTES: 1 %
LYMPHS PCT: 14 %
Lymphs Abs: 1.3 10*3/uL (ref 0.7–4.0)
MCH: 28.4 pg (ref 26.0–34.0)
MCHC: 31.9 g/dL (ref 30.0–36.0)
MCV: 89.1 fL (ref 80.0–100.0)
MONO ABS: 1.2 10*3/uL — AB (ref 0.1–1.0)
MONOS PCT: 12 %
NRBC: 0 % (ref 0.0–0.2)
Neutro Abs: 6.9 10*3/uL (ref 1.7–7.7)
Neutrophils Relative %: 72 %
PLATELETS: 210 10*3/uL (ref 150–400)
RBC: 4.3 MIL/uL (ref 3.87–5.11)
RDW: 11.8 % (ref 11.5–15.5)
WBC: 9.5 10*3/uL (ref 4.0–10.5)

## 2018-10-02 LAB — URINALYSIS, ROUTINE W REFLEX MICROSCOPIC
Bilirubin Urine: NEGATIVE
GLUCOSE, UA: NEGATIVE mg/dL
Ketones, ur: NEGATIVE mg/dL
Nitrite: NEGATIVE
PH: 6 (ref 5.0–8.0)
Protein, ur: NEGATIVE mg/dL
SPECIFIC GRAVITY, URINE: 1.01 (ref 1.005–1.030)
WBC, UA: >50 WBC/hpf — ABNORMAL HIGH (ref 0–5)

## 2018-10-02 LAB — COMPREHENSIVE METABOLIC PANEL
ALBUMIN: 4 g/dL (ref 3.5–5.0)
ALK PHOS: 97 U/L (ref 38–126)
ALT: 12 U/L (ref 0–44)
AST: 19 U/L (ref 15–41)
Anion gap: 9 (ref 5–15)
BILIRUBIN TOTAL: 0.5 mg/dL (ref 0.3–1.2)
BUN: 7 mg/dL (ref 6–20)
CALCIUM: 9.4 mg/dL (ref 8.9–10.3)
CO2: 24 mmol/L (ref 22–32)
Chloride: 105 mmol/L (ref 98–111)
Creatinine, Ser: 0.64 mg/dL (ref 0.44–1.00)
GFR calc Af Amer: >60 mL/min (ref >60)
GFR calc non Af Amer: >60 mL/min (ref >60)
Glucose, Bld: 72 mg/dL (ref 70–99)
Potassium: 3.7 mmol/L (ref 3.5–5.1)
SODIUM: 138 mmol/L (ref 135–145)
TOTAL PROTEIN: 7.6 g/dL (ref 6.5–8.1)

## 2018-10-02 LAB — PREGNANCY, URINE: Preg Test, Ur: NEGATIVE

## 2018-10-02 LAB — LIPASE, BLOOD: Lipase: 33 U/L (ref 11–51)

## 2018-10-02 MED ORDER — SODIUM CHLORIDE 0.9 % IV BOLUS
1000.0000 mL | Freq: Once | INTRAVENOUS | Status: AC
  Administered 2018-10-02: 1000 mL via INTRAVENOUS

## 2018-10-02 MED ORDER — IBUPROFEN 400 MG PO TABS: 600.0000 mg | ORAL_TABLET | Freq: Once | ORAL | Status: DC

## 2018-10-02 NOTE — ED Provider Notes (Signed)
[facility] EMERGENCY DEPARTMENT Provider Note   CSN: [id number] Arrival date & time: 10/02/18  1356     History   Chief Complaint No chief complaint on file.   HPI Nancy [name] is a 28 y.o. female who presents today for evaluation of body aches, low back pain, and generally not feeling well.  She is currently being treated for a UTI.  She states she started taking antibiotics on Friday.  She reports that her urinary symptoms have improved significantly.  She is not having dysuria increased frequency or urgency.  She denies any fevers.  She has not taken any ibuprofen or Tylenol today.  Her main concern is her low back pain and the possibility of having the flu.  Her pain in her back is the same on each side.  It is made worse with touch and movement, made better with being still.    HPI  Past Medical History:  Diagnosis Date  . Colitis   . Medical history non-contributory     Patient Active Problem List   Diagnosis Date Noted  . Low back pain 01/13/2016    Past Surgical History:  Procedure Laterality Date  . CESAREAN SECTION    . CESAREAN SECTION N/A 11/11/2013   Procedure: CESAREAN SECTION With Bilateral Tubal Ligation;  Surgeon: Mitchel Honour, DO;  Location: [facility];  Service: Obstetrics;  Laterality: N/A;     OB History    Gravida  2   Para  2   Term  1   Preterm  1   AB  0   Living  3     SAB  0   TAB  0   Ectopic  0   Multiple  1   Live Births  3            Home Medications    Prior to Admission medications   Medication Sig Start Date End Date Taking? Authorizing Provider  busPIRone (BUSPAR) 10 MG tablet Take 10 mg by mouth 2 (two) times daily as needed for anxiety. 05/02/17   [provider]  clindamycin (CLEOCIN) 150 MG capsule Take 1 capsule (150 mg total) by mouth every 6 (six) hours. 05/06/17   Fayrene Helper, PA-C  HYDROcodone-acetaminophen (NORCO/VICODIN) 5-325 MG tablet Take 1 tablet by mouth every 4  (four) hours as needed. 09/19/18   Jacalyn Lefevre, MD  ibuprofen (ADVIL,MOTRIN) 600 MG tablet Take 1 tablet (600 mg total) by mouth every 6 (six) hours as needed. 09/19/18   Jacalyn Lefevre, MD  meloxicam (MOBIC) 15 MG tablet Take 1 tablet (15 mg total) by mouth daily. Patient not taking: Reported on 01/10/2017 01/13/16   [name], MD  Multiple Vitamins-Minerals (MULTIVITAMIN WITH MINERALS) tablet Take 1 tablet by mouth daily.    [provider]  predniSONE (DELTASONE) 20 MG tablet Take 2 tabs po daily for 6 days. Take with food. Patient not taking: Reported on 01/10/2017 08/23/16   [name], NP  prochlorperazine (COMPAZINE) 10 MG tablet Take 1 tablet (10 mg total) by mouth 2 (two) times daily as needed for nausea or vomiting. Patient not taking: Reported on 03/01/2017 01/10/17   Tegeler, Canary Brim, MD  promethazine (PHENERGAN) 25 MG tablet Take 1 tablet (25 mg total) by mouth every 6 (six) hours as needed for nausea or vomiting. 05/06/17   Fayrene Helper, PA-C    Family History Family History  Family history unknown: Yes    Social History Social History   Tobacco  Use  . Smoking status: Never Smoker  . Smokeless tobacco: Never Used  Substance Use Topics  . Alcohol use: Yes  . Drug use: No     Allergies   Flagyl [metronidazole] and Other   Review of Systems Review of Systems  Constitutional: Positive for chills. Negative for fever.  Respiratory: Negative for cough, chest tightness and shortness of breath.   Cardiovascular: Negative for chest pain.  Gastrointestinal: Negative for diarrhea, nausea and vomiting.  Genitourinary: Negative for dysuria, flank pain, frequency and urgency.  Musculoskeletal: Positive for arthralgias, back pain and myalgias. Negative for neck pain.  Skin: Negative for color change and rash.  Neurological: Negative for light-headedness.  All other systems reviewed and are negative.    Physical Exam Updated Vital Signs BP 102/63 (BP  Location: Right Arm)   Pulse 69   Temp 98.6 F (37 C) (Oral)   Resp 18   Ht 5\' 1"  (1.549 m)   Wt 54.4 kg   LMP 09/07/2018   SpO2 99%   BMI 22.67 kg/m   Physical Exam Vitals signs and nursing note reviewed.  Constitutional:      General: She is not in acute distress.    Appearance: She is not ill-appearing.  HENT:     Head: Normocephalic.     Right Ear: Tympanic membrane, ear canal and external ear normal.     Left Ear: Tympanic membrane, ear canal and external ear normal.     Nose: No congestion.     Mouth/Throat:     Mouth: Mucous membranes are moist.     Pharynx: No oropharyngeal exudate or posterior oropharyngeal erythema.  Eyes:     Conjunctiva/sclera: Conjunctivae normal.  Neck:     Musculoskeletal: Normal range of motion and neck supple. No neck rigidity.  Cardiovascular:     Rate and Rhythm: Normal rate.     Pulses: Normal pulses.     Heart sounds: Normal heart sounds.  Pulmonary:     Effort: Pulmonary effort is normal. No respiratory distress.  Abdominal:     General: Abdomen is flat. There is no distension.     Palpations: There is no mass.     Tenderness: There is no abdominal tenderness. There is no guarding.     Comments: There is CVA tenderness to palpation, as with the entire rest of her lower back.  There is pain with percussion however it is not any worse or different than the pain with palpation over CVA bilaterally.  Musculoskeletal:     Comments: There is diffuse tenderness to palpation of bilateral lower back without midline tenderness to palpation, step-offs, or deformities.    Lymphadenopathy:     Cervical: No cervical adenopathy.  Skin:    General: Skin is warm.  Neurological:     Mental Status: She is alert.  Psychiatric:        Mood and Affect: Mood normal.      ED Treatments / Results  Labs (all labs ordered are listed, but only abnormal results are displayed) Labs Reviewed  URINALYSIS, ROUTINE W REFLEX MICROSCOPIC - Abnormal;  Notable for the following components:      Result Value   APPearance HAZY (*)    Hgb urine dipstick SMALL (*)    Leukocytes, UA SMALL (*)    WBC, UA >50 (*)    Bacteria, UA FEW (*)    All other components within normal limits  CBC WITH DIFFERENTIAL/PLATELET - Abnormal; Notable for the following components:   Monocytes  Absolute 1.2 (*)    All other components within normal limits  URINE CULTURE  PREGNANCY, URINE  COMPREHENSIVE METABOLIC PANEL  LIPASE, BLOOD    EKG None  Radiology Dg Chest 2 View  Result Date: 10/02/2018 CLINICAL DATA:  Chest pain for 2 days. EXAM: CHEST - 2 VIEW COMPARISON:  05/05/2017 and prior radiographs FINDINGS: The cardiomediastinal silhouette is unremarkable. There is no evidence of focal airspace disease, pulmonary edema, suspicious pulmonary nodule/mass, pleural effusion, or pneumothorax. No acute bony abnormalities are identified. IMPRESSION: No active cardiopulmonary disease. Electronically Signed   By: Harmon PierJeffrey  Hu M.D.   On: 10/02/2018 14:49    Procedures Procedures (including critical care time)  Medications Ordered in ED Medications  ibuprofen (ADVIL,MOTRIN) tablet 600 mg (has no administration in time range)  sodium chloride 0.9 % bolus 1,000 mL (0 mLs Intravenous Stopped 10/02/18 1928)     Initial Impression / Assessment and Plan / ED Course  I have reviewed the triage vital signs and the nursing notes.  Pertinent labs & imaging results that were available during my care of the patient were reviewed by me and considered in my medical decision making (see chart for details).    Patient presents today for evaluation of generalized body aches, primarily in her lower back.  She is concerned that she may have the flu.  She is afebrile, not tachycardic or tachypneic and has not taken any antipyretics today.  She is currently being treated for UTI and reports feeling worse, therefore labs were obtained.  Does not have a significant leukocytosis.  No  anemia.  Her lipase is not elevated, she does not have any significant electrolyte derangements.  Urine culture was sent.  UA is hazy with small blood, small leukocytes and over 50 WBCs with few bacteria.  She reports compliance with her antibiotics.  Suspect that this is still related to her UTI, however urine culture was sent.  Her pregnancy test is negative.  She is afebrile, not tachycardic or tachypneic, does not meet Sirs or sepsis criteria.  She has diffuse lower back tenderness to palpation.  She does have bilateral CVA tenderness to percussion, however it is not any worse than palpation of that area, therefore suspect that this is related to musculoskeletal pain rather than renal pain.  Patient was given 1 L of saline in the department per her request, after which she felt better.   Return precautions were discussed with patient who states their understanding.  At the time of discharge patient denied any unaddressed complaints or concerns.  Patient is agreeable for discharge home.   Final Clinical Impressions(s) / ED Diagnoses   Final diagnoses:  Acute cystitis with hematuria  Generalized body aches  Bilateral low back pain without sciatica, unspecified chronicity    ED Discharge Orders    None       Norman ClayHammond, Blandina Renaldo W, PA-C 10/02/18 2114    Doug SouJacubowitz, Sam, MD 10/03/18 [phone]

## 2018-10-02 NOTE — ED Triage Notes (Signed)
Pt here with c/o flu like symptoms ,pt is currently being treated for a UTI

## 2018-10-02 NOTE — Discharge Instructions (Addendum)
Please take Ibuprofen (Advil, motrin) and Tylenol (acetaminophen) to relieve your pain.  You may take up to 600 MG (3 pills) of normal strength ibuprofen every 8 hours as needed.  In between doses of ibuprofen you make take tylenol, up to 1,000 mg (two extra strength pills).  Do not take more than 3,000 mg tylenol in a 24 hour period.  Please check all medication labels as many medications such as pain and cold medications may contain tylenol.  Do not drink alcohol while taking these medications.  Do not take other NSAID'S while taking ibuprofen (such as aleve or naproxen).  Please take ibuprofen with food to decrease stomach upset.  Please make sure that you are drinking plenty of water and staying well-hydrated.  Today your labs and chest x-ray were reassuring.  Please keep taking your antibiotics.  Please schedule a follow-up appointment with your primary care doctor.

## 2018-10-04 LAB — URINE CULTURE

## 2018-10-05 ENCOUNTER — Telehealth: Payer: Self-pay | Admitting: *Deleted

## 2018-10-05 NOTE — Telephone Encounter (Signed)
Post ED Visit - Positive Culture Follow-up  Culture report reviewed by antimicrobial stewardship pharmacist:  []  Enzo Bi, Pharm.D. []  Celedonio Miyamoto, [address].D., BCPS AQ-ID []  Garvin Fila, Pharm.D., BCPS []  Georgina Pillion, [address].D., BCPS []  [place], [address].D., BCPS, AAHIVP []  Estella Husk, Pharm.D., BCPS, AAHIVP [x]  Lysle Pearl, PharmD, BCPS []  Phillips Climes, PharmD, BCPS []  Agapito Games, PharmD, BCPS []  Verlan Friends, PharmD  Positive urine culture, reviewed by Aviva Kluver PA-C and no further patient follow-up is required at this time.  Virl Axe [facility] 10/05/2018, 11:58 AM

## 2018-10-19 ENCOUNTER — Ambulatory Visit
Admission: EM | Admit: 2018-10-19 | Discharge: 2018-10-19 | Disposition: A | Discharge status: Home / Self Care | Payer: Medicaid Other | Attending: Physician Assistant | Admitting: Physician Assistant

## 2018-10-19 ENCOUNTER — Encounter: Payer: Self-pay | Admitting: Emergency Medicine

## 2018-10-19 DIAGNOSIS — B349 Viral infection, unspecified: Secondary | ICD-10-CM | POA: Diagnosis present

## 2018-10-19 LAB — POCT URINALYSIS DIP (MANUAL ENTRY)
Bilirubin, UA: NEGATIVE
GLUCOSE UA: NEGATIVE mg/dL
Ketones, POC UA: NEGATIVE mg/dL
LEUKOCYTES UA: NEGATIVE
NITRITE UA: POSITIVE — AB
PROTEIN UA: NEGATIVE mg/dL
Spec Grav, UA: 1.025 (ref 1.010–1.025)
UROBILINOGEN UA: 0.2 U/dL
pH, UA: 7 (ref 5.0–8.0)

## 2018-10-19 LAB — POCT INFLUENZA A/B
INFLUENZA A, POC: NEGATIVE
INFLUENZA B, POC: NEGATIVE

## 2018-10-19 MED ORDER — ONDANSETRON 4 MG PO TBDP: 4.0000 mg | ORAL_TABLET | Freq: Three times a day (TID) | ORAL | 0 refills | Status: DC | PRN

## 2018-10-19 NOTE — ED Notes (Signed)
Patient able to ambulate independently  

## 2018-10-19 NOTE — ED Triage Notes (Signed)
Pt presents to [facility] for assessment of nausea, emesis, body aches, headaches, cough, mild congestion x 2 days.  Also wants her urine rechecked after having an infection 2 weeks ago.

## 2018-10-19 NOTE — ED Provider Notes (Signed)
[facility]    CSN: [id number] Arrival date & time: 10/19/18  1004     History   Chief Complaint Chief Complaint  Patient presents with  . flu like symptoms    HPI Nancy [name] is a 28 y.o. female.   28 year old female comes in for 2-day history of flulike symptoms.  States she has had body aches, chills, nausea, vomiting, cough, mild congestion, headache.  States she has had nausea with 2 episodes of nonbilious nonbloody emesis.  She has mild abdominal pain that is intermittent, worse after eating.  Denies diarrhea.  Denies urinary symptoms such as frequency, dysuria, hematuria.  States she took ibuprofen 600 mg with some relief of body aches.  Never smoker.  Positive sick contact.     Past Medical History:  Diagnosis Date  . Colitis   . Medical history non-contributory     Patient Active Problem List   Diagnosis Date Noted  . Low back pain 01/13/2016    Past Surgical History:  Procedure Laterality Date  . CESAREAN SECTION    . CESAREAN SECTION N/A 11/11/2013   Procedure: CESAREAN SECTION With Bilateral Tubal Ligation;  Surgeon: Mitchel Honour, DO;  Location: [facility];  Service: Obstetrics;  Laterality: N/A;    OB History    Gravida  2   Para  2   Term  1   Preterm  1   AB  0   Living  3     SAB  0   TAB  0   Ectopic  0   Multiple  1   Live Births  3            Home Medications    Prior to Admission medications   Medication Sig Start Date End Date Taking? Authorizing Provider  busPIRone (BUSPAR) 10 MG tablet Take 10 mg by mouth 2 (two) times daily as needed for anxiety. 05/02/17   [provider]  ibuprofen (ADVIL,MOTRIN) 600 MG tablet Take 1 tablet (600 mg total) by mouth every 6 (six) hours as needed. 09/19/18   Jacalyn Lefevre, MD  Multiple Vitamins-Minerals (MULTIVITAMIN WITH MINERALS) tablet Take 1 tablet by mouth daily.    [provider]  ondansetron (ZOFRAN ODT) 4 MG disintegrating tablet Take 1  tablet (4 mg total) by mouth every 8 (eight) hours as needed for nausea or vomiting. 10/19/18   Belinda Fisher, PA-C    Family History Family History  Family history unknown: Yes    Social History Social History   Tobacco Use  . Smoking status: Never Smoker  . Smokeless tobacco: Never Used  Substance Use Topics  . Alcohol use: Yes  . Drug use: No     Allergies   Flagyl [metronidazole] and Other   Review of Systems Review of Systems  Reason unable to perform ROS: See HPI as above.     Physical Exam Triage Vital Signs ED Triage Vitals  Enc Vitals Group     BP 10/19/18 1014 105/77     Pulse Rate 10/19/18 1014 84     Resp 10/19/18 1014 16     Temp 10/19/18 1014 98 F (36.7 C)     Temp Source 10/19/18 1014 Oral     SpO2 10/19/18 1014 99 %     Weight --      Height --      Head Circumference --      Peak Flow --      Pain Score 10/19/18 1019 10  Pain Loc --      Pain Edu? --      Excl. in GC? --    No data found.  Updated Vital Signs BP 105/77 (BP Location: Left Arm)   Pulse 84   Temp 98 F (36.7 C) (Oral)   Resp 16   LMP 10/08/2018   SpO2 99%   Physical Exam Constitutional:      General: She is not in acute distress.    Appearance: She is well-developed. She is not ill-appearing, toxic-appearing or diaphoretic.  HENT:     Head: Normocephalic and atraumatic.     Right Ear: Tympanic membrane, ear canal and external ear normal. Tympanic membrane is not erythematous or bulging.     Left Ear: Tympanic membrane, ear canal and external ear normal. Tympanic membrane is not erythematous or bulging.     Nose: Nose normal.     Right Sinus: No maxillary sinus tenderness or frontal sinus tenderness.     Left Sinus: No maxillary sinus tenderness or frontal sinus tenderness.     Mouth/Throat:     Mouth: Mucous membranes are moist.     Pharynx: Oropharynx is clear. Uvula midline.  Eyes:     Conjunctiva/sclera: Conjunctivae normal.     Pupils: Pupils are equal,  round, and reactive to light.  Neck:     Musculoskeletal: Normal range of motion and neck supple.  Cardiovascular:     Rate and Rhythm: Normal rate and regular rhythm.     Heart sounds: Normal heart sounds. No murmur. No friction rub. No gallop.   Pulmonary:     Effort: Pulmonary effort is normal. No respiratory distress.     Breath sounds: Normal breath sounds. No stridor. No decreased breath sounds, wheezing, rhonchi or rales.  Abdominal:     General: Bowel sounds are normal.     Palpations: Abdomen is soft.     Tenderness: There is no abdominal tenderness. There is no right CVA tenderness, left CVA tenderness, guarding or rebound.  Skin:    General: Skin is warm and dry.  Neurological:     Mental Status: She is alert and oriented to person, place, and time.  Psychiatric:        Behavior: Behavior normal.        Judgment: Judgment normal.      UC Treatments / Results  Labs (all labs ordered are listed, but only abnormal results are displayed) Labs Reviewed  POCT URINALYSIS DIP (MANUAL ENTRY) - Abnormal; Notable for the following components:      Result Value   Blood, UA small (*)    Nitrite, UA Positive (*)    All other components within normal limits  URINE CULTURE  POCT INFLUENZA A/B    EKG None  Radiology No results found.  Procedures Procedures (including critical care time)  Medications Ordered in UC Medications - No data to display  Initial Impression / Assessment and Plan / UC Course  I have reviewed the triage vital signs and the nursing notes.  Pertinent labs & imaging results that were available during my care of the patient were reviewed by me and considered in my medical decision making (see chart for details).    Exam reassuring. Discussed given exam, does not indicate tamiflu, can do symptomatic treatment. Patient requesting flu testing.  Rapid flu negative. Symptomatic treatment discussed. Push fluids. Return precautions given.  Dipstick  with positive nitrite, small blood. Patient without urinary symptoms. Discussed low suspicion for residual UTI. Will send  for culture.   Final Clinical Impressions(s) / UC Diagnoses   Final diagnoses:  Viral illness   ED Prescriptions    Medication Sig Dispense Auth. Provider   ondansetron (ZOFRAN ODT) 4 MG disintegrating tablet Take 1 tablet (4 mg total) by mouth every 8 (eight) hours as needed for nausea or vomiting. 10 tablet Threasa Alpha, [place] 10/19/18 1208

## 2018-10-19 NOTE — Discharge Instructions (Signed)
Rapid flu negative. Zofran as needed for nausea/vomiting. You can use over the counter nasal saline rinse such as neti pot for nasal congestion. Keep hydrated, your urine should be clear to pale yellow in color. Tylenol/motrin for fever and pain. Monitor for any worsening of symptoms, chest pain, shortness of breath, wheezing, swelling of the throat, follow up for reevaluation.   For sore throat/cough try using a honey-based tea. Use 3 teaspoons of honey with juice squeezed from half lemon. Place shaved pieces of ginger into 1/2-1 cup of water and warm over stove top. Then mix the ingredients and repeat every 4 hours as needed.

## 2018-10-21 LAB — URINE CULTURE

## 2018-10-22 ENCOUNTER — Telehealth (HOSPITAL_COMMUNITY): Payer: Self-pay | Admitting: Emergency Medicine

## 2018-10-22 MED ORDER — CIPROFLOXACIN HCL 500 MG PO TABS: 500.0000 mg | ORAL_TABLET | Freq: Two times a day (BID) | ORAL | 0 refills | Status: AC

## 2018-10-22 NOTE — Telephone Encounter (Signed)
Per Dr. Leonides Grills, pt needs treatment for UTI. Cipro 500mg  BID x3 days sent to pharmacy on record. Attempted to contact patient. NO answer. Voicemail left.

## 2018-10-23 ENCOUNTER — Telehealth (HOSPITAL_COMMUNITY): Payer: Self-pay | Admitting: Emergency Medicine

## 2018-10-23 NOTE — Telephone Encounter (Signed)
Patient contacted and made aware of all results, all questions answered.   

## 2018-10-24 ENCOUNTER — Ambulatory Visit
Admission: EM | Admit: 2018-10-24 | Discharge: 2018-10-24 | Disposition: A | Discharge status: Home / Self Care | Payer: Medicaid Other | Attending: Family Medicine | Admitting: Family Medicine

## 2018-10-24 DIAGNOSIS — R300 Dysuria: Secondary | ICD-10-CM | POA: Diagnosis present

## 2018-10-24 DIAGNOSIS — Z711 Person with feared health complaint in whom no diagnosis is made: Secondary | ICD-10-CM | POA: Diagnosis present

## 2018-10-24 LAB — POCT URINALYSIS DIP (MANUAL ENTRY)
BILIRUBIN UA: NEGATIVE mg/dL
Bilirubin, UA: NEGATIVE
GLUCOSE UA: NEGATIVE mg/dL
LEUKOCYTES UA: NEGATIVE
Nitrite, UA: NEGATIVE
PROTEIN UA: NEGATIVE mg/dL
SPEC GRAV UA: 1.01 (ref 1.010–1.025)
UROBILINOGEN UA: 0.2 U/dL
pH, UA: 5.5 (ref 5.0–8.0)

## 2018-10-24 NOTE — Discharge Instructions (Addendum)

## 2018-10-24 NOTE — ED Provider Notes (Signed)
[facility]   [id number] 10/24/18 Arrival Time: 1015  ASSESSMENT & PLAN:  1. Dysuria   2. Concern about STD in female without diagnosis    Has taken two doses of Cipro. Urine culture shows bacteria sensitive. Will continue and finish. Declines empiric tx for gonorrhea/chlamydia. Prefers to await cytology results. No s/s of PID.   Discharge Instructions     We have treated you today, we have sent testing for sexually transmitted infections. We will notify you of any positive results once they are received. If required, we will prescribe any medications you might need.  Please refrain from all sexual activity for at least the next seven days.    Pending: Labs Reviewed  POCT URINALYSIS DIP (MANUAL ENTRY) - Abnormal; Notable for the following components:      Result Value   Blood, UA trace-lysed (*)    All other components within normal limits  HIV ANTIBODY (ROUTINE TESTING W REFLEX)  RPR  CERVICOVAGINAL ANCILLARY ONLY    Will notify of any positive results.  Reviewed expectations re: course of current medical issues. Questions answered. Outlined signs and symptoms indicating need for more acute intervention. Patient verbalized understanding. After Visit Summary given.   SUBJECTIVE:  Nancy [name] is a 28 y.o. female who presents with complaint of vaginal discharge and dysuria. Has been see a few times for dysuria and was placed on Cipro at her most recent visit; has take two doses and questions very slight improvement. Vaginal discharge first noticed several days ago. Describes discharge as thick and white/yellow. No hematuria reported. Afebrile. No abdominal or pelvic pain. No n/v. No rashes or lesions. Sexually active with single female partner. Last sexual activity one week ago without condom use. OTC treatment: none. History of STI: No.  Patient's last menstrual period was 10/08/2018.  ROS: As per HPI. All other systems  negative.  OBJECTIVE:  Vitals:   10/24/18 1048  BP: 107/76  Pulse: 85  Resp: 18  Temp: 98.1 F (36.7 C)  TempSrc: Oral  SpO2: 98%    General appearance: alert, cooperative, appears stated age and no distress Throat: lips, mucosa, and tongue normal; teeth and gums normal CV: RRR Lungs: CTAB Back: no CVA tenderness; FROM at waist Abdomen: soft, non-tender; no guarding or rebound tenderness GU: deferred Skin: warm and dry Psychological: alert and cooperative; normal mood and affect.  Results for orders placed or performed during the hospital encounter of 10/24/18  POCT urinalysis dipstick  Result Value Ref Range   Color, UA yellow yellow   Clarity, UA clear clear   Glucose, UA negative negative mg/dL   Bilirubin, UA negative negative   Ketones, POC UA negative negative mg/dL   Spec Grav, UA 2.694 8.546 - 1.025   Blood, UA trace-lysed (A) negative   pH, UA 5.5 5.0 - 8.0   Protein Ur, POC negative negative mg/dL   Urobilinogen, UA 0.2 0.2 or 1.0 E.U./dL   Nitrite, UA Negative Negative   Leukocytes, UA Negative Negative    Allergies  Allergen Reactions  . Flagyl [Metronidazole] Other (See Comments)    Per pt felt shaking, headache, nausea.  . Other Other (See Comments)    Pt stated that she was nausea, vomiting, headache when she was prescribed cholinergic meds (not remember exactly) in the past.     Past Medical History:  Diagnosis Date  . Colitis   . Medical history non-contributory    Family History  Family history unknown: Yes   Social History  Socioeconomic History  . Marital status: Legally Separated    Spouse name: Not on file  . Number of children: Not on file  . Years of education: Not on file  . Highest education level: Not on file  Occupational History  . Not on file  Social Needs  . Financial resource strain: Not on file  . Food insecurity:    Worry: Not on file    Inability: Not on file  . Transportation needs:    Medical: Not on file     Non-medical: Not on file  Tobacco Use  . Smoking status: Never Smoker  . Smokeless tobacco: Never Used  Substance and Sexual Activity  . Alcohol use: Yes  . Drug use: No  . Sexual activity: Yes    Birth control/protection: None  Lifestyle  . Physical activity:    Days per week: Not on file    Minutes per session: Not on file  . Stress: Not on file  Relationships  . Social connections:    Talks on phone: Not on file    Gets together: Not on file    Attends religious service: Not on file    Active member of club or organization: Not on file    Attends meetings of clubs or organizations: Not on file    Relationship status: Not on file  . Intimate partner violence:    Fear of current or ex partner: Not on file    Emotionally abused: Not on file    Physically abused: Not on file    Forced sexual activity: Not on file  Other Topics Concern  . Not on file  Social History Narrative  . Not on file          Mardella Layman, MD 10/24/18 1120

## 2018-10-24 NOTE — ED Triage Notes (Signed)
Pt c/o UTI x3wks, on 3rd antibotic. C/o flank pain, vaginal discharge and odor as well. Requesting STD check

## 2018-10-25 LAB — CERVICOVAGINAL ANCILLARY ONLY
Bacterial vaginitis: NEGATIVE
CANDIDA VAGINITIS: NEGATIVE
CHLAMYDIA, DNA PROBE: NEGATIVE
Neisseria Gonorrhea: NEGATIVE
Trichomonas: NEGATIVE

## 2018-10-25 LAB — HIV ANTIBODY (ROUTINE TESTING W REFLEX): HIV Screen 4th Generation wRfx: NONREACTIVE

## 2018-10-25 LAB — RPR: RPR Ser Ql: NONREACTIVE

## 2019-10-12 ENCOUNTER — Emergency Department (HOSPITAL_COMMUNITY)
Admission: EM | Admit: 2019-10-12 | Discharge: 2019-10-12 | Disposition: A | Discharge status: Home / Self Care | Payer: Medicaid Other | Attending: Emergency Medicine | Admitting: Emergency Medicine

## 2019-10-12 ENCOUNTER — Emergency Department (HOSPITAL_COMMUNITY): Payer: Medicaid Other

## 2019-10-12 ENCOUNTER — Other Ambulatory Visit: Payer: Self-pay

## 2019-10-12 DIAGNOSIS — R1013 Epigastric pain: Secondary | ICD-10-CM | POA: Diagnosis not present

## 2019-10-12 DIAGNOSIS — R112 Nausea with vomiting, unspecified: Secondary | ICD-10-CM | POA: Insufficient documentation

## 2019-10-12 DIAGNOSIS — R1010 Upper abdominal pain, unspecified: Secondary | ICD-10-CM | POA: Insufficient documentation

## 2019-10-12 LAB — COMPREHENSIVE METABOLIC PANEL
ALT: 22 U/L (ref 0–44)
AST: 25 U/L (ref 15–41)
Albumin: 4.4 g/dL (ref 3.5–5.0)
Alkaline Phosphatase: 89 U/L (ref 38–126)
Anion gap: 10 (ref 5–15)
BUN: 9 mg/dL (ref 6–20)
CO2: 25 mmol/L (ref 22–32)
Calcium: 9.7 mg/dL (ref 8.9–10.3)
Chloride: 106 mmol/L (ref 98–111)
Creatinine, Ser: 0.73 mg/dL (ref 0.44–1.00)
GFR calc Af Amer: >60 mL/min (ref >60)
GFR calc non Af Amer: >60 mL/min (ref >60)
Glucose, Bld: 78 mg/dL (ref 70–99)
Potassium: 3.7 mmol/L (ref 3.5–5.1)
Sodium: 141 mmol/L (ref 135–145)
Total Bilirubin: 0.7 mg/dL (ref 0.3–1.2)
Total Protein: 7.4 g/dL (ref 6.5–8.1)

## 2019-10-12 LAB — I-STAT BETA HCG BLOOD, ED (MC, WL, AP ONLY): I-stat hCG, quantitative: <5 m[IU]/mL (ref <5)

## 2019-10-12 LAB — URINALYSIS, ROUTINE W REFLEX MICROSCOPIC
Bilirubin Urine: NEGATIVE
Glucose, UA: NEGATIVE mg/dL
Hgb urine dipstick: NEGATIVE
Ketones, ur: NEGATIVE mg/dL
Leukocytes,Ua: NEGATIVE
Nitrite: NEGATIVE
Protein, ur: NEGATIVE mg/dL
Specific Gravity, Urine: 1.015 (ref 1.005–1.030)
pH: 6 (ref 5.0–8.0)

## 2019-10-12 LAB — CBC
HCT: 41.9 % (ref 36.0–46.0)
Hemoglobin: 13.5 g/dL (ref 12.0–15.0)
MCH: 29.4 pg (ref 26.0–34.0)
MCHC: 32.2 g/dL (ref 30.0–36.0)
MCV: 91.3 fL (ref 80.0–100.0)
Platelets: 225 10*3/uL (ref 150–400)
RBC: 4.59 MIL/uL (ref 3.87–5.11)
RDW: 11.3 % — ABNORMAL LOW (ref 11.5–15.5)
WBC: 5.4 10*3/uL (ref 4.0–10.5)
nRBC: 0 % (ref 0.0–0.2)

## 2019-10-12 LAB — LIPASE, BLOOD: Lipase: 26 U/L (ref 11–51)

## 2019-10-12 MED ORDER — SODIUM CHLORIDE 0.9% FLUSH: 3.0000 mL | Freq: Once | INTRAVENOUS | Status: DC

## 2019-10-12 MED ORDER — LIDOCAINE VISCOUS HCL 2 % MT SOLN
15.0000 mL | Freq: Once | OROMUCOSAL | Status: AC
  Administered 2019-10-12: 15 mL via ORAL
  Filled 2019-10-12: qty 15

## 2019-10-12 MED ORDER — SUCRALFATE 1 GM/10ML PO SUSP: 1.0000 g | Freq: Three times a day (TID) | ORAL | 0 refills | Status: DC

## 2019-10-12 MED ORDER — PANTOPRAZOLE SODIUM 20 MG PO TBEC: 20.0000 mg | DELAYED_RELEASE_TABLET | Freq: Every day | ORAL | 0 refills | Status: DC

## 2019-10-12 MED ORDER — ALUM & MAG HYDROXIDE-SIMETH 200-200-20 MG/5ML PO SUSP
30.0000 mL | Freq: Once | ORAL | Status: AC
  Administered 2019-10-12: 13:00:00 30 mL via ORAL
  Filled 2019-10-12: qty 30

## 2019-10-12 NOTE — Discharge Instructions (Signed)
Take Protonix daily. Use Carafate as needed for pain. Avoid anti-inflammatories such as ibuprofen.  You may take Tylenol as needed for pain. Avoid spicy, greasy, acidic foods. There is information about this in the paperwork. Make sure you remain upright after eating for at least 60 minutes to prevent discomfort. Follow-up with the stomach doctor listed below for further evaluation. Return to the emergency room if any new, worsening, concerning symptoms.

## 2019-10-12 NOTE — ED Notes (Signed)
PT voices understanding of DC instructions.

## 2019-10-12 NOTE — ED Triage Notes (Signed)
Pt. Stated, Nancy [name] had stomach pain with nausea, and bloating for a month. Seen by [facility] 2 weeks ago.

## 2019-10-12 NOTE — ED Provider Notes (Signed)
[facility] EMERGENCY DEPARTMENT Provider Note   CSN: [id number] Arrival date & time: 10/12/19  1004     History Chief Complaint  Patient presents with  . Abdominal Pain  . Nausea    Nancy [name] is a 29 y.o. female presenting for evaluation of abdominal pain and nausea.  Patient states for the past month, she has had intermittent abdominal pain.  Pain occurs after eating, is described as a sharp stabbing pain.  It is epigastric.  She has associated nausea.  She states she is thrown up a few times this month, but does not throw up every time.  She states pain is present every time after she has p.o.  Pain radiates to her back, does not radiate elsewhere.  When she has pain, she takes ibuprofen, Tums, and another indigestion medicine which helps her symptoms.  She is not on any daily PPI or H2 blocker.  She denies fevers, chills, chest pain, shortness of breath, cough, urinary symptoms, abnormal bowel movements.  Last period ended 1 week ago, is normal for her.  She denies previous history of abdominal problems or surgeries.  She saw her primary care doctor 2 weeks ago who referred her to OB/GYN, as at that time patient was having slightly lower abdominal pain.  She has never seen a GI doctor.   HPI     Past Medical History:  Diagnosis Date  . Colitis   . Medical history non-contributory     Patient Active Problem List   Diagnosis Date Noted  . Low back pain 01/13/2016    Past Surgical History:  Procedure Laterality Date  . CESAREAN SECTION    . CESAREAN SECTION N/A 11/11/2013   Procedure: CESAREAN SECTION With Bilateral Tubal Ligation;  Surgeon: Linda Hedges, DO;  Location: [facility];  Service: Obstetrics;  Laterality: N/A;     OB History    Gravida  2   Para  2   Term  1   Preterm  1   AB  0   Living  3     SAB  0   TAB  0   Ectopic  0   Multiple  1   Live Births  3           Family History  Family history unknown: Yes     Social History   Tobacco Use  . Smoking status: Never Smoker  . Smokeless tobacco: Never Used  Substance Use Topics  . Alcohol use: Yes  . Drug use: No    Home Medications Prior to Admission medications   Medication Sig Start Date End Date Taking? Authorizing Provider  busPIRone (BUSPAR) 10 MG tablet Take 10 mg by mouth 2 (two) times daily as needed for anxiety. 05/02/17   [provider]  ibuprofen (ADVIL,MOTRIN) 600 MG tablet Take 1 tablet (600 mg total) by mouth every 6 (six) hours as needed. 09/19/18   Isla Pence, MD  Multiple Vitamins-Minerals (MULTIVITAMIN WITH MINERALS) tablet Take 1 tablet by mouth daily.    [provider]  ondansetron (ZOFRAN ODT) 4 MG disintegrating tablet Take 1 tablet (4 mg total) by mouth every 8 (eight) hours as needed for nausea or vomiting. 10/19/18   Tasia Catchings, Amy V, PA-C  pantoprazole (PROTONIX) 20 MG tablet Take 1 tablet (20 mg total) by mouth daily. 10/12/19   Arkeem Harts, PA-C  sucralfate (CARAFATE) 1 GM/10ML suspension Take 10 mLs (1 g total) by mouth 4 (four) times daily -  with  meals and at bedtime. 10/12/19   Jessicah Croll, PA-C    Allergies    Flagyl [metronidazole] and Other  Review of Systems   Review of Systems  Gastrointestinal: Positive for abdominal pain (intermittent), nausea and vomiting (intermittent).  All other systems reviewed and are negative.   Physical Exam Updated Vital Signs BP 115/67 (BP Location: Left Arm)   Pulse 63   Temp 98.8 F (37.1 C) (Oral)   Resp 16   LMP 09/28/2019   SpO2 100%   Physical Exam Vitals and nursing note reviewed.  Constitutional:      General: She is not in acute distress.    Appearance: She is well-developed.     Comments: Resting comfortably in the bed in no acute distress  HENT:     Head: Normocephalic and atraumatic.  Eyes:     Conjunctiva/sclera: Conjunctivae normal.     Pupils: Pupils are equal, round, and reactive to light.  Cardiovascular:     Rate  and Rhythm: Normal rate and regular rhythm.  Pulmonary:     Effort: Pulmonary effort is normal. No respiratory distress.     Breath sounds: Normal breath sounds. No wheezing.  Abdominal:     General: There is no distension.     Palpations: Abdomen is soft. There is no mass.     Tenderness: There is abdominal tenderness. There is no guarding or rebound.     Comments: Tenderness palpation of epigastric abdomen.  No tenderness palpation elsewhere in the abdomen.  No rigidity, guarding, distention.  Negative rebound.  No peritonitis.  Negative Murphy's.  Negative CVA.  Musculoskeletal:        General: Normal range of motion.     Cervical back: Normal range of motion and neck supple.  Skin:    General: Skin is warm and dry.     Capillary Refill: Capillary refill takes less than 2 seconds.  Neurological:     Mental Status: She is alert and oriented to person, place, and time.     ED Results / Procedures / Treatments   Labs (all labs ordered are listed, but only abnormal results are displayed) Labs Reviewed  CBC - Abnormal; Notable for the following components:      Result Value   RDW 11.3 (*)    All other components within normal limits  LIPASE, BLOOD  COMPREHENSIVE METABOLIC PANEL  URINALYSIS, ROUTINE W REFLEX MICROSCOPIC  I-STAT BETA HCG BLOOD, ED ([facility], [facility], [facility] ONLY)    EKG None  Radiology US Abdomen Limited RUQ  Result Date: 10/12/2019 CLINICAL DATA:  Upper abdominal pain for 1 month. EXAM: ULTRASOUND ABDOMEN LIMITED RIGHT UPPER QUADRANT COMPARISON:  CT abdomen and pelvis 09/19/2018. FINDINGS: Gallbladder: No gallstones or wall thickening visualized. No sonographic Murphy sign noted by sonographer. Common bile duct: Diameter: 0.2 cm Liver: No focal lesion identified. Within normal limits in parenchymal echogenicity. Portal vein is patent on color Doppler imaging with normal direction of blood flow towards the liver. Other: None. IMPRESSION: Normal exam. Electronically Signed   By:  Drusilla Kanner M.D.   On: 10/12/2019 12:48    Procedures Procedures (including critical care time)  Medications Ordered in ED Medications  sodium chloride flush (NS) 0.9 % injection 3 mL (has no administration in time range)  alum & mag hydroxide-simeth (MAALOX/MYLANTA) 200-200-20 MG/5ML suspension 30 mL (has no administration in time range)    And  lidocaine (XYLOCAINE) 2 % viscous mouth solution 15 mL (has no administration in time range)  ED Course  I have reviewed the triage vital signs and the nursing notes.  Pertinent labs & imaging results that were available during my care of the patient were reviewed by me and considered in my medical decision making (see chart for details).    MDM Rules/Calculators/A&P                      Patient presented for evaluation of abdominal pain.  Physical exam reassuring, she appears nontoxic.  Pain has been intermittent, worse after eating.  Consider gallstones versus gastritis versus PUD.  Less likely pancreatitis versus cholecystitis.  Labs obtained from triage reviewed, no leukocytosis.  Electrolytes stable.  Hemoglobin stable.  LFTs and lipase normal.  Will obtain right upper quadrant ultrasound to rule out gallstones.  If negative, likely PUD/gastritis/GERD.  Ultrasound negative for gallstones or infection.  Discussed findings with patient.  Discussed symptomatic treatment and follow-up with GI as needed.  At this time, patient appears safe for discharge.  Return precautions given.  Patient states she understands and agrees to plan.  Final Clinical Impression(s) / ED Diagnoses Final diagnoses:  Upper abdominal pain  Epigastric abdominal pain    Rx / DC Orders ED Discharge Orders         Ordered    pantoprazole (PROTONIX) 20 MG tablet  Daily     10/12/19 1308    sucralfate (CARAFATE) 1 GM/10ML suspension  3 times daily with meals & bedtime     10/12/19 1308           Willford Rabideau, PA-C 10/12/19 1309    Terald Sleeper, MD 10/12/19 1550

## 2020-05-28 IMAGING — DX DG CHEST 2V
2 series · 2 of 2 positions shown · non-contrast
Comparison: 05/05/2017 and prior radiographs

CLINICAL DATA: Chest pain for 2 days.

EXAM:
CHEST - 2 VIEW

[w chest pa]
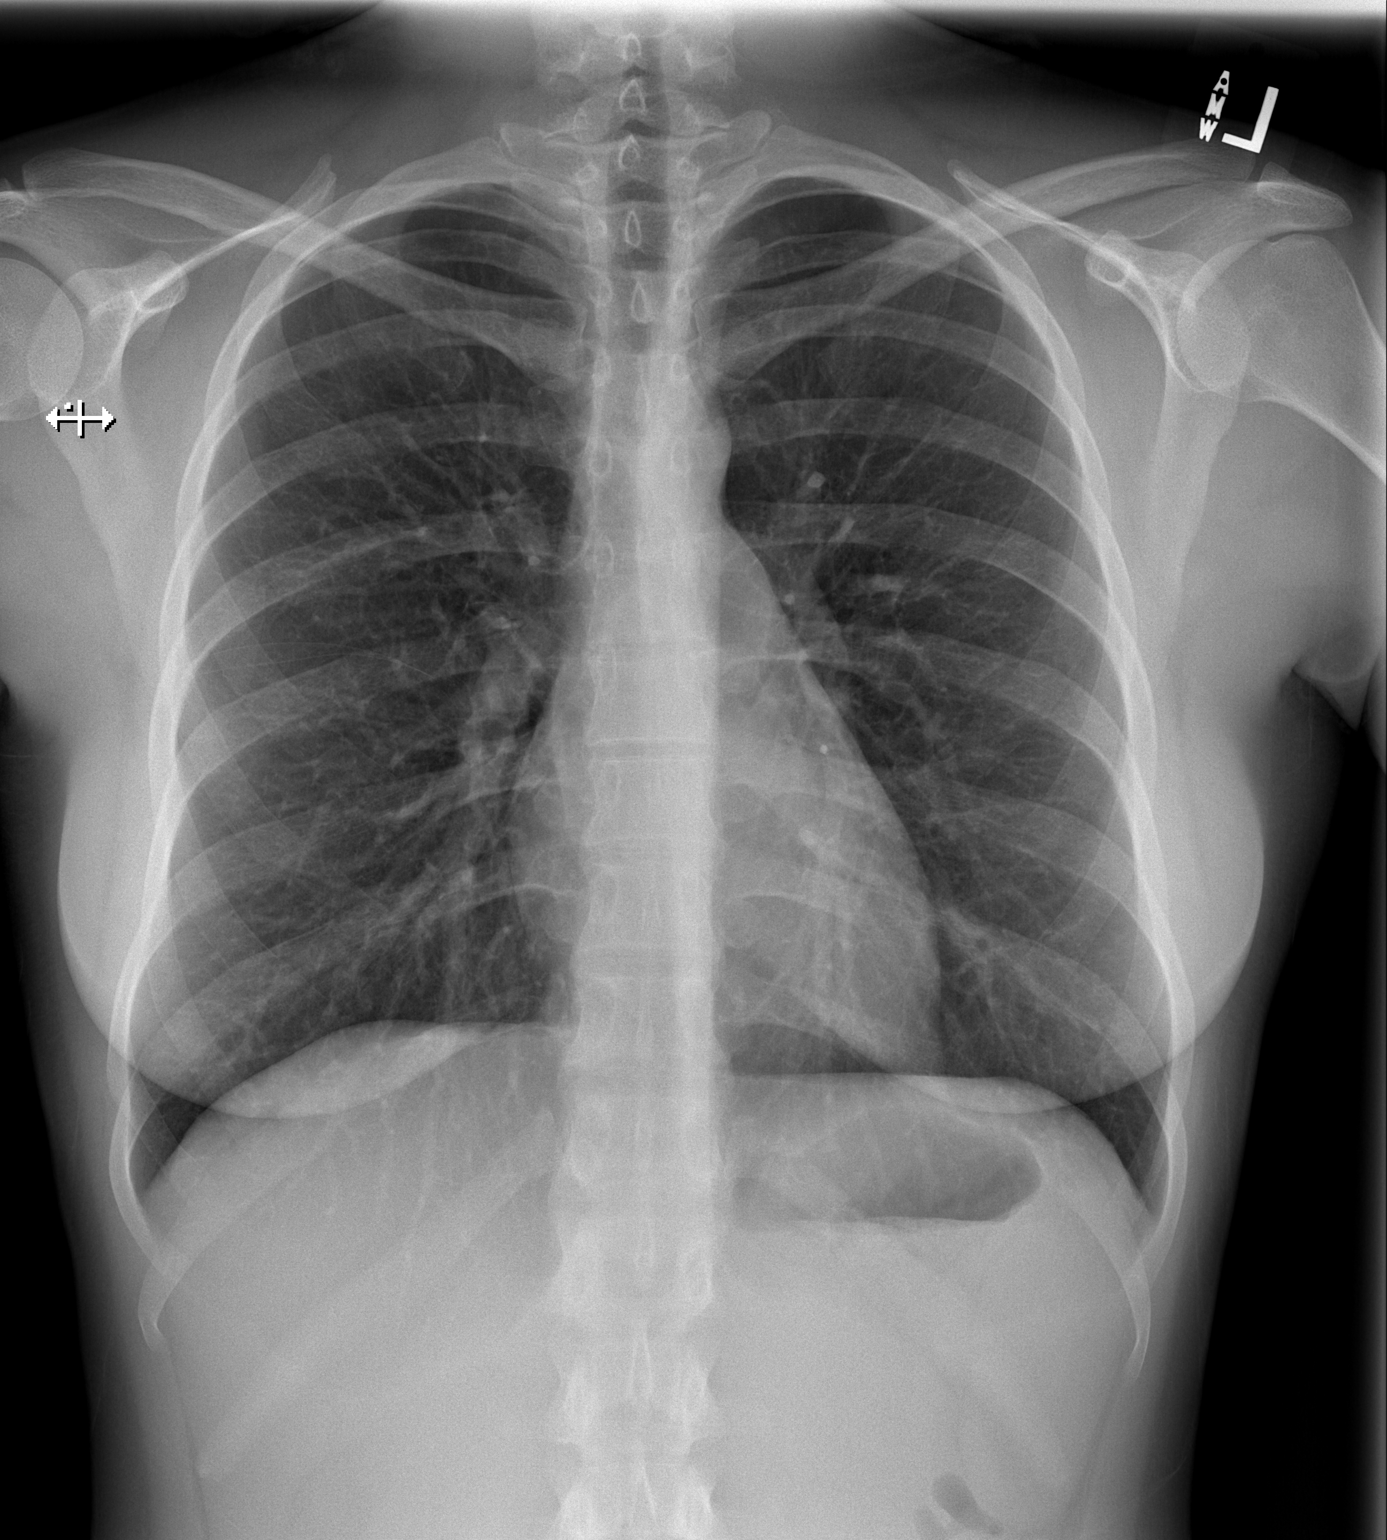

[w chest lat]
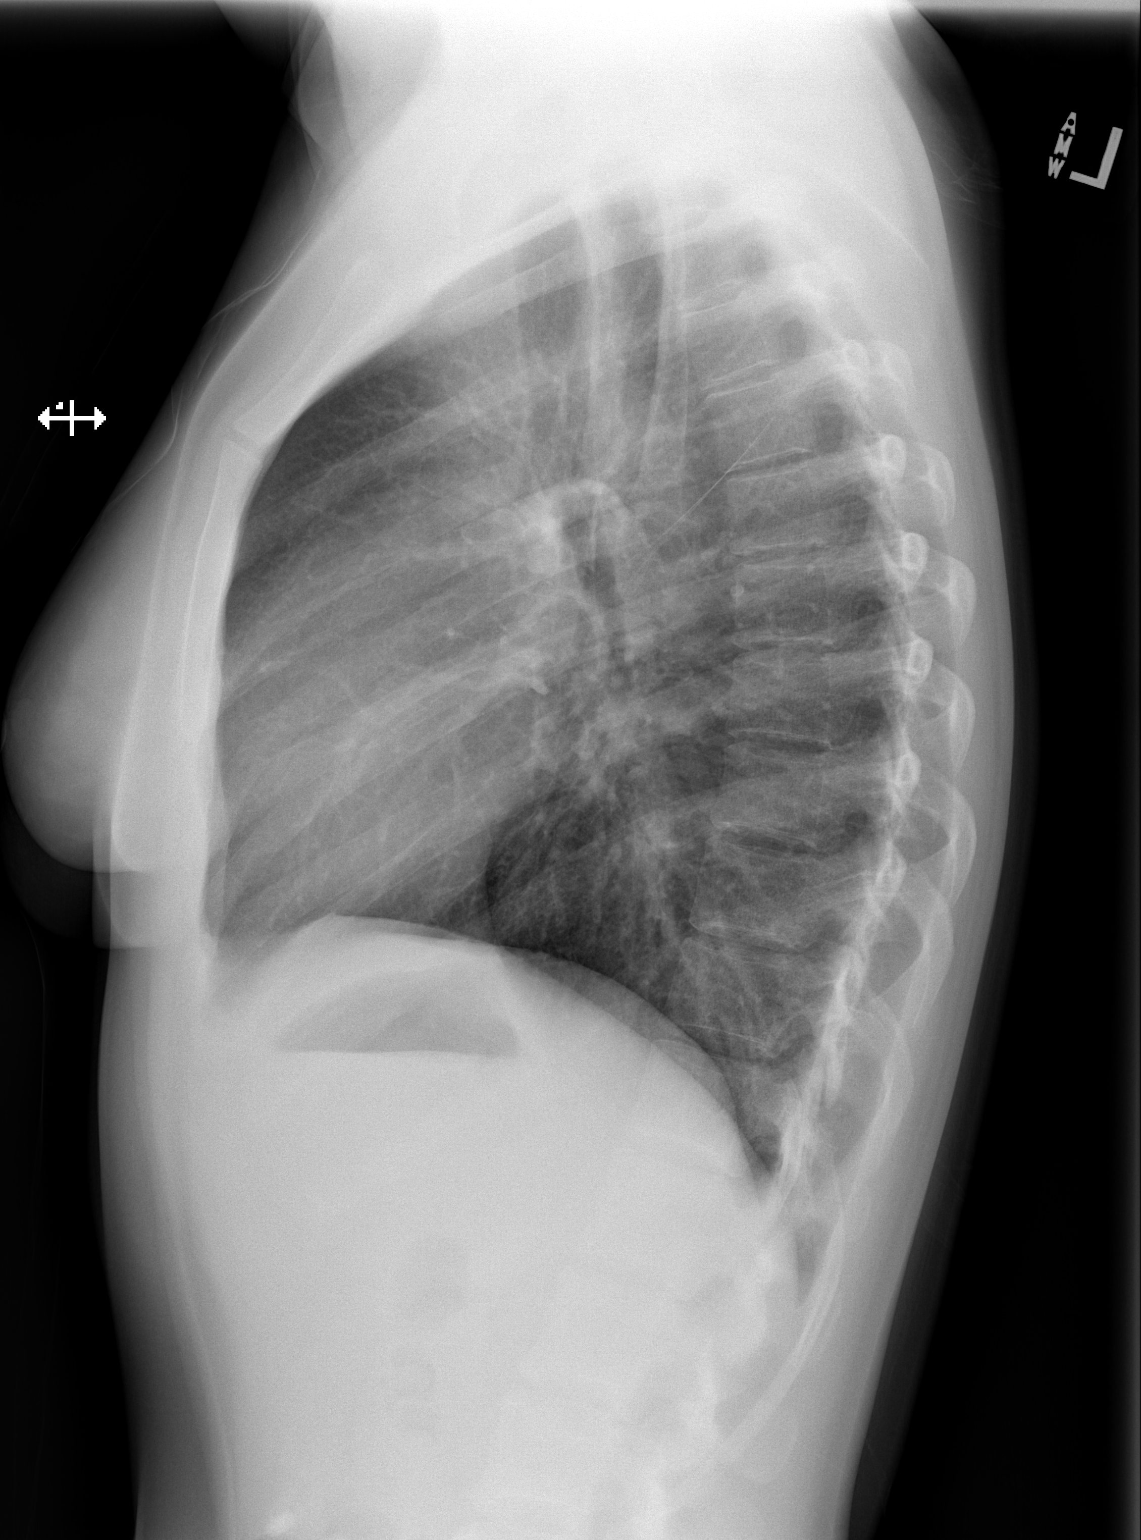

[2 of 2 positions shown; findings below may reference images not displayed]

FINDINGS: The cardiomediastinal silhouette is unremarkable.

There is no evidence of focal airspace disease, pulmonary edema,
suspicious pulmonary nodule/mass, pleural effusion, or pneumothorax.

No acute bony abnormalities are identified.
IMPRESSION: No active cardiopulmonary disease.

## 2021-06-07 IMAGING — US US ABDOMEN LIMITED
1 series · 14 of 25 positions shown · non-contrast
Comparison: CT abdomen and pelvis 09/19/2018.

CLINICAL DATA: Upper abdominal pain for 1 month.

EXAM:
ULTRASOUND ABDOMEN LIMITED RIGHT UPPER QUADRANT

[Series 1: us abdomen limited · 14 of 58 slices shown]
[im 1/58]
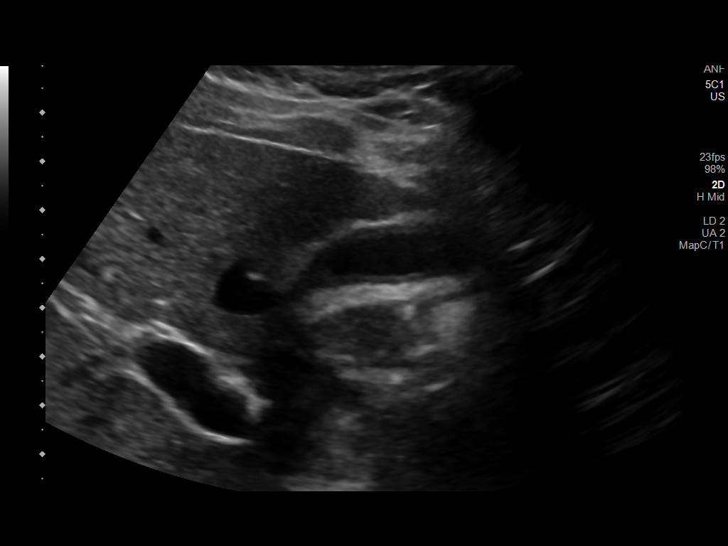
[im 5/58]
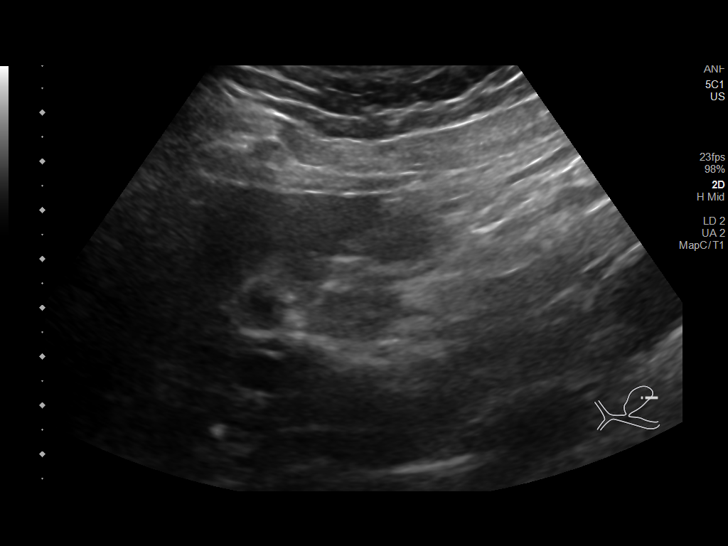
[im 10/58]
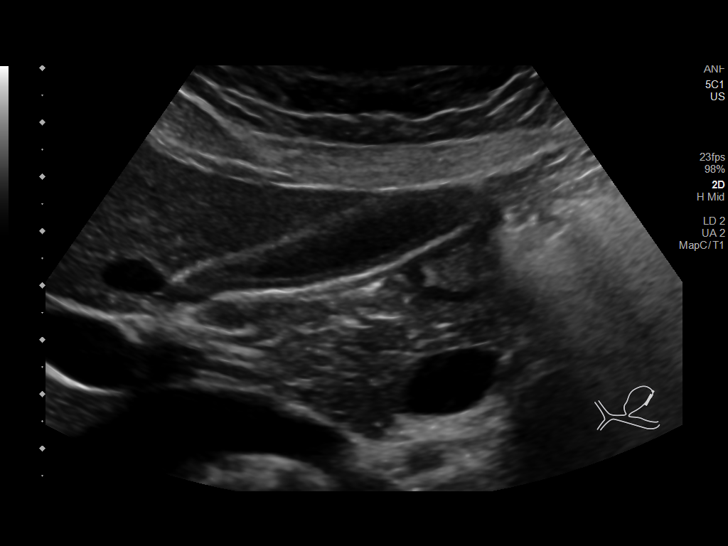
[im 15/58]
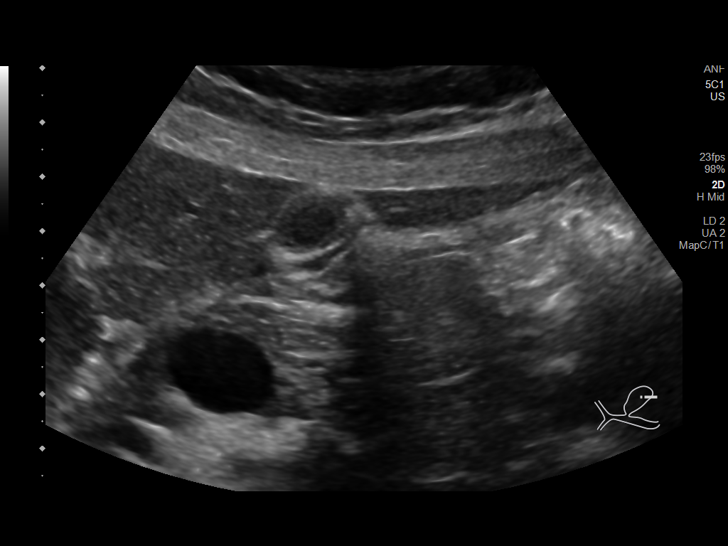
[im 20/58]
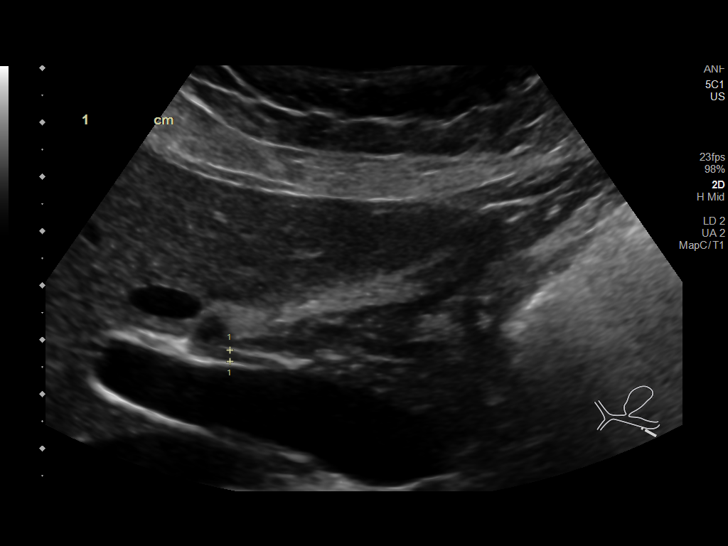
[im 22/58]
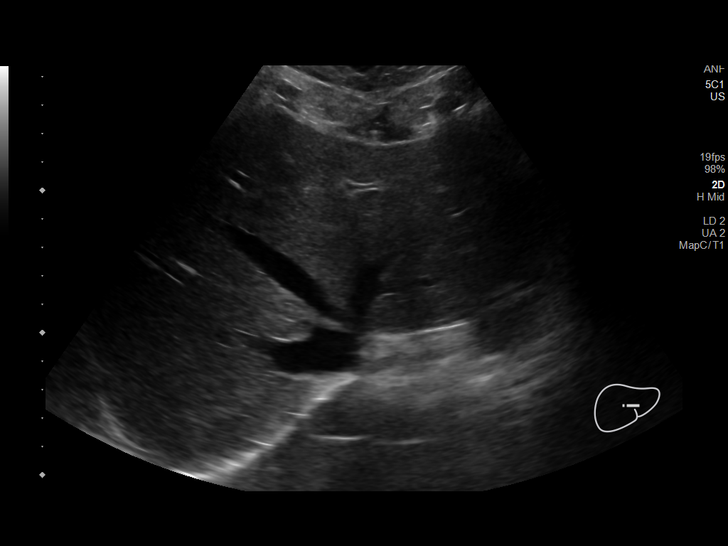
[im 27/58]
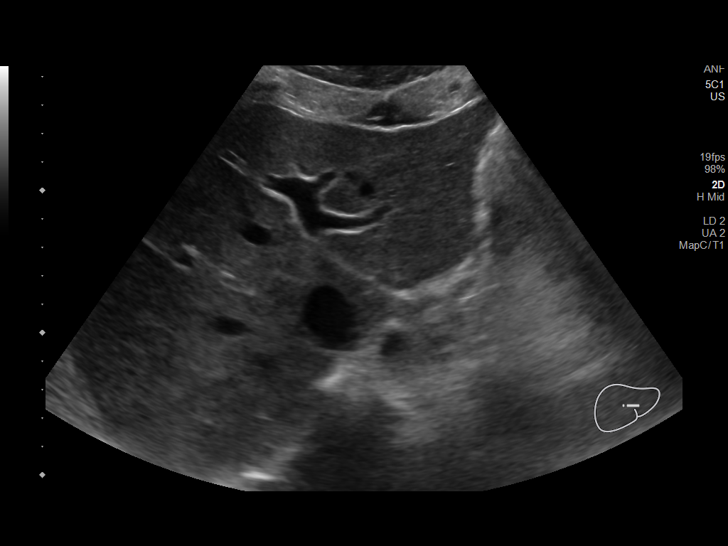
[im 31/58]
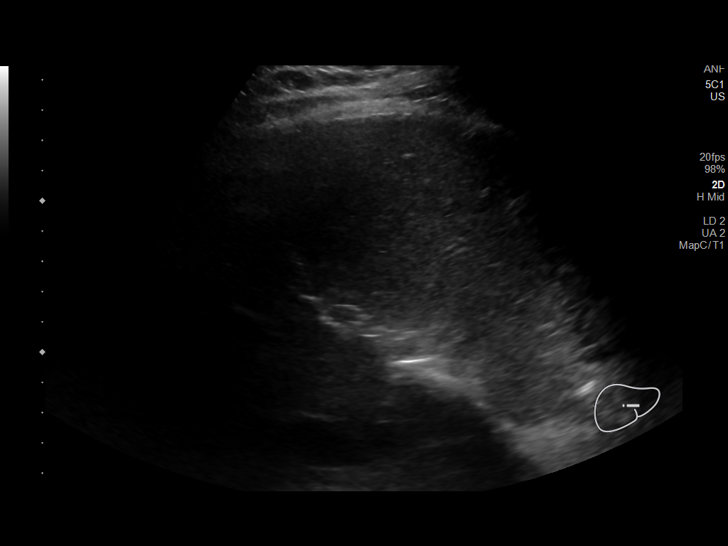
[im 36/58]
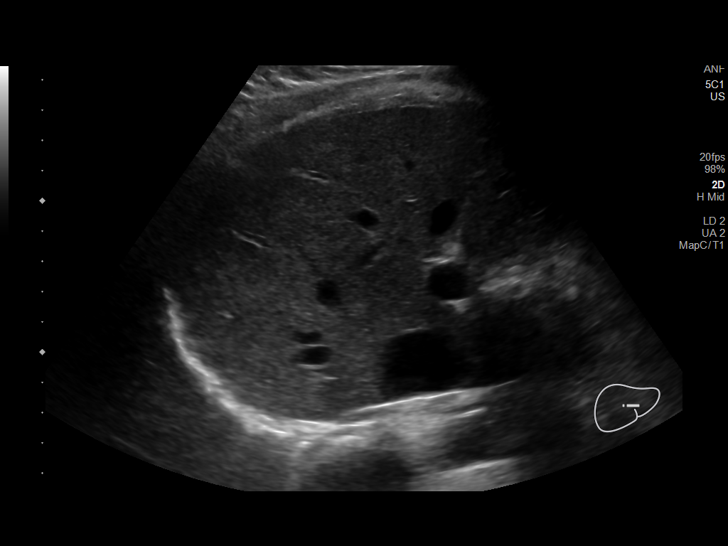
[im 39/58]
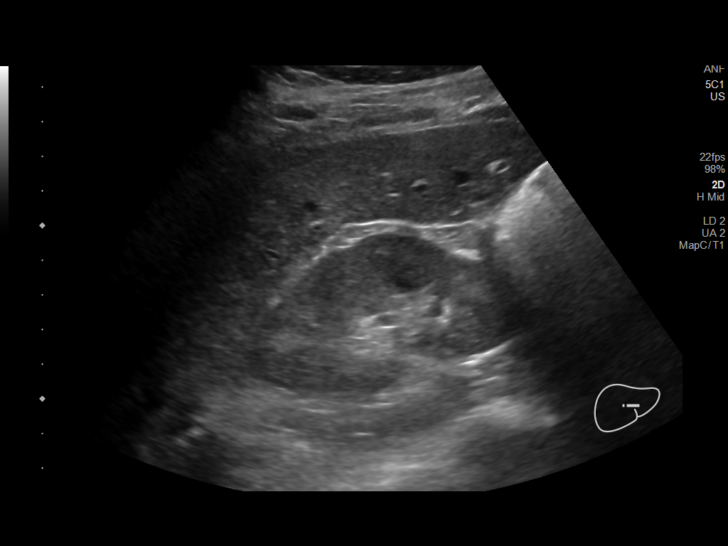
[im 43/58]
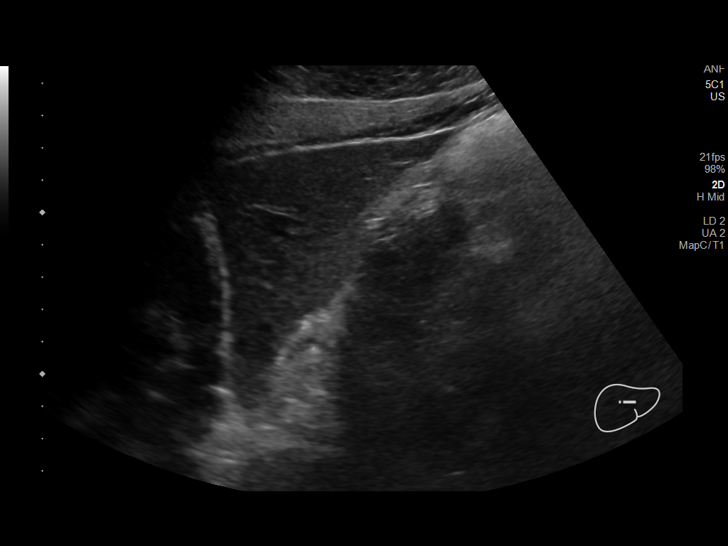
[im 48/58]
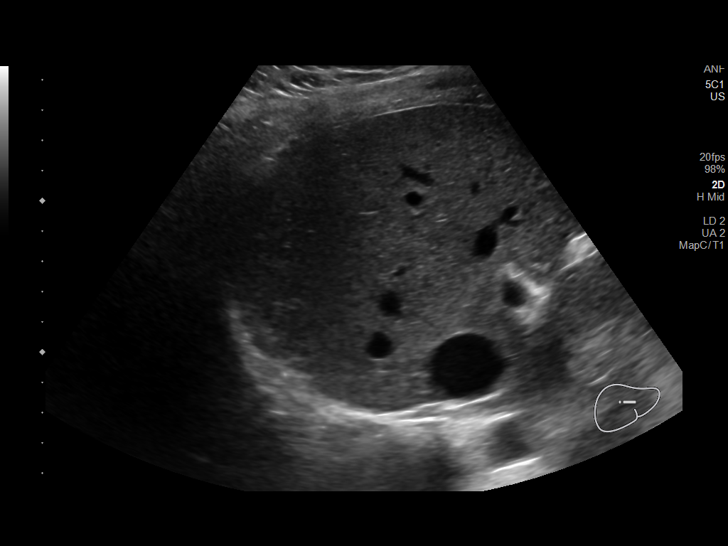
[im 53/58]
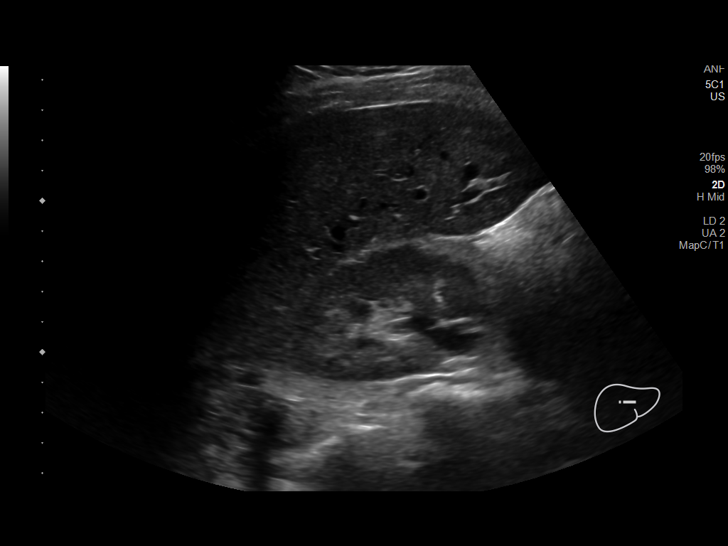
[im 58/58]
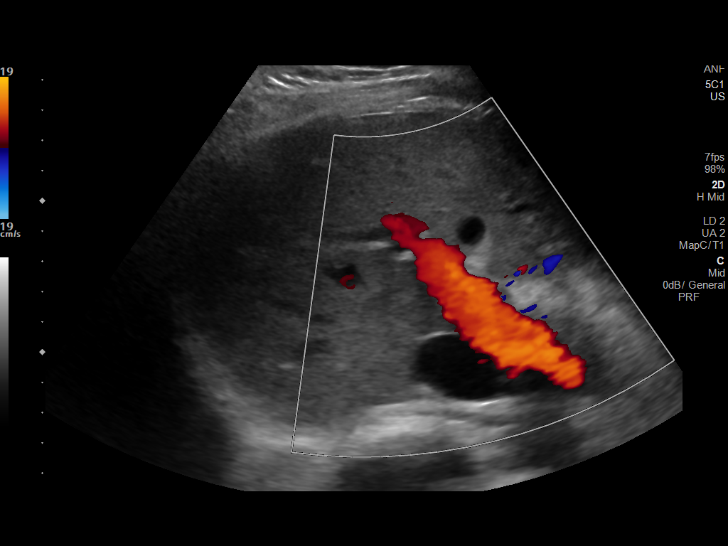

[14 of 25 positions shown; findings below may reference images not displayed]

FINDINGS: Gallbladder:

No gallstones or wall thickening visualized. No sonographic Murphy
sign noted by sonographer.

Common bile duct:

Diameter: 0.2 cm

Liver:

No focal lesion identified. Within normal limits in parenchymal
echogenicity. Portal vein is patent on color Doppler imaging with
normal direction of blood flow towards the liver.

Other: None.
IMPRESSION: Normal exam.

## 2022-05-04 ENCOUNTER — Encounter: Payer: Self-pay | Admitting: Gastroenterology

## 2022-06-07 ENCOUNTER — Encounter: Payer: Self-pay | Admitting: Gastroenterology

## 2022-06-07 ENCOUNTER — Other Ambulatory Visit (INDEPENDENT_AMBULATORY_CARE_PROVIDER_SITE_OTHER): Payer: Medicaid Other

## 2022-06-07 ENCOUNTER — Ambulatory Visit (INDEPENDENT_AMBULATORY_CARE_PROVIDER_SITE_OTHER): Payer: Medicaid Other | Admitting: Gastroenterology

## 2022-06-07 VITALS — BP 96/60 | HR 78 | Ht 61.0 in | Wt 155.0 lb

## 2022-06-07 DIAGNOSIS — R1013 Epigastric pain: Secondary | ICD-10-CM | POA: Insufficient documentation

## 2022-06-07 DIAGNOSIS — R110 Nausea: Secondary | ICD-10-CM | POA: Diagnosis not present

## 2022-06-07 DIAGNOSIS — K219 Gastro-esophageal reflux disease without esophagitis: Secondary | ICD-10-CM | POA: Diagnosis not present

## 2022-06-07 DIAGNOSIS — R140 Abdominal distension (gaseous): Secondary | ICD-10-CM | POA: Diagnosis not present

## 2022-06-07 LAB — CBC WITH DIFFERENTIAL/PLATELET
Basophils Absolute: 0 10*3/uL (ref 0.0–0.1)
Basophils Relative: 0.4 % (ref 0.0–3.0)
Eosinophils Absolute: 0.1 10*3/uL (ref 0.0–0.7)
Eosinophils Relative: 1 % (ref 0.0–5.0)
HCT: 39.3 % (ref 36.0–46.0)
Hemoglobin: 12.9 g/dL (ref 12.0–15.0)
Lymphocytes Relative: 25.3 % (ref 12.0–46.0)
Lymphs Abs: 1.4 10*3/uL (ref 0.7–4.0)
MCHC: 32.9 g/dL (ref 30.0–36.0)
MCV: 88.4 fl (ref 78.0–100.0)
Monocytes Absolute: 0.5 10*3/uL (ref 0.1–1.0)
Monocytes Relative: 9 % (ref 3.0–12.0)
Neutro Abs: 3.4 10*3/uL (ref 1.4–7.7)
Neutrophils Relative %: 64.3 % (ref 43.0–77.0)
Platelets: 215 10*3/uL (ref 150.0–400.0)
RBC: 4.44 Mil/uL (ref 3.87–5.11)
RDW: 12.4 % (ref 11.5–15.5)
WBC: 5.3 10*3/uL (ref 4.0–10.5)

## 2022-06-07 LAB — COMPREHENSIVE METABOLIC PANEL
ALT: 13 U/L (ref 0–35)
AST: 17 U/L (ref 0–37)
Albumin: 4.5 g/dL (ref 3.5–5.2)
Alkaline Phosphatase: 98 U/L (ref 39–117)
BUN: 7 mg/dL (ref 6–23)
CO2: 26 mEq/L (ref 19–32)
Calcium: 9.7 mg/dL (ref 8.4–10.5)
Chloride: 104 mEq/L (ref 96–112)
Creatinine, Ser: 0.83 mg/dL (ref 0.40–1.20)
GFR: 94.72 mL/min (ref >60.00)
Glucose, Bld: 96 mg/dL (ref 70–99)
Potassium: 3.9 mEq/L (ref 3.5–5.1)
Sodium: 138 mEq/L (ref 135–145)
Total Bilirubin: 0.5 mg/dL (ref 0.2–1.2)
Total Protein: 7.3 g/dL (ref 6.0–8.3)

## 2022-06-07 LAB — C-REACTIVE PROTEIN: CRP: <1 mg/dL (ref 0.5–20.0)

## 2022-06-07 LAB — IBC + FERRITIN
Ferritin: 11.9 ng/mL (ref 10.0–291.0)
Iron: 146 ug/dL — ABNORMAL HIGH (ref 42–145)
Saturation Ratios: 36.2 % (ref 20.0–50.0)
TIBC: 403.2 ug/dL (ref 250.0–450.0)
Transferrin: 288 mg/dL (ref 212.0–360.0)

## 2022-06-07 LAB — SEDIMENTATION RATE: Sed Rate: 14 mm/hr (ref 0–20)

## 2022-06-07 LAB — TSH: TSH: 2.62 u[IU]/mL (ref 0.35–5.50)

## 2022-06-07 NOTE — Progress Notes (Signed)
06/07/2022 Nancy [name] [id number] 07/19/1991   HISTORY OF PRESENT ILLNESS: This is a 31 year old female who is new to our office.  Has been referred here by Ferd Hibbs, NP, for refractory GERD and IBS.  She tells me for the past 2 years she has been feeling "sick on her stomach" all the time.  She has epigastric abdominal pain and reflux.  She started on pantoprazole 20 mg daily initially and that really seemed to help, about 6 months ago it was increased to 40 mg daily.  That does help the epigastric abdominal pain and reflux, but she says if she misses it at all then her symptoms return.  She says that she has a lot of abdominal bloating and is very gassy with both belching and flatulence.  She says she has tried probiotics in the past, but they did not seem to help.  Says that she moves her bowels every other day or every third day.  She says that sometimes it is loose or runny and sometimes she will have a few bowel movements within a days time depending what she eats.  Says that tends to occur with more fried foods.  She has noticed that some dairy products seem to cause some of her GI distress.  She does take ibuprofen 3 pills two or three times a week.  She says that she started on iron supplements a year or 2 ago because she was told that her iron levels were low.  Denies any blood in her stools.   Past Medical History:  Diagnosis Date   Colitis    GERD (gastroesophageal reflux disease)    Medical history non-contributory    Past Surgical History:  Procedure Laterality Date   CESAREAN SECTION     CESAREAN SECTION N/A 11/11/2013   Procedure: CESAREAN SECTION With Bilateral Tubal Ligation;  Surgeon: Linda Hedges, DO;  Location: [facility];  Service: Obstetrics;  Laterality: N/A;    reports that she has never smoked. She has never used smokeless tobacco. She reports current alcohol use. She reports that she does not use drugs. family history includes Breast cancer in her  maternal grandmother and paternal grandmother; Colon cancer in her maternal grandmother; Hypertension in her mother. Allergies  Allergen Reactions   Flagyl [Metronidazole] Other (See Comments)    Per pt felt shaking, headache, nausea.   Other Other (See Comments)    Pt stated that she was nausea, vomiting, headache when she was prescribed cholinergic meds (not remember exactly) in the past.       Outpatient Encounter Medications as of 06/07/2022  Medication Sig   Ferrous Sulfate (IRON) 325 (65 Fe) MG TABS Take by mouth.   ibuprofen (ADVIL,MOTRIN) 600 MG tablet Take 1 tablet (600 mg total) by mouth every 6 (six) hours as needed.   Multiple Vitamins-Minerals (MULTIVITAMIN WITH MINERALS) tablet Take 1 tablet by mouth daily.   pantoprazole (PROTONIX) 20 MG tablet Take 1 tablet by mouth daily.   [DISCONTINUED] busPIRone (BUSPAR) 10 MG tablet Take 10 mg by mouth 2 (two) times daily as needed for anxiety.   [DISCONTINUED] ondansetron (ZOFRAN ODT) 4 MG disintegrating tablet Take 1 tablet (4 mg total) by mouth every 8 (eight) hours as needed for nausea or vomiting.   [DISCONTINUED] pantoprazole (PROTONIX) 20 MG tablet Take 1 tablet (20 mg total) by mouth daily.   [DISCONTINUED] sucralfate (CARAFATE) 1 GM/10ML suspension Take 10 mLs (1 g total) by mouth 4 (four) times daily -  with  meals and at bedtime.   No facility-administered encounter medications on file as of 06/07/2022.    REVIEW OF SYSTEMS  : All other systems reviewed and negative except where noted in the History of Present Illness.   PHYSICAL EXAM: BP 96/60   Pulse 78   Ht 5\' 1"  (1.549 m)   Wt 155 lb (70.3 kg)   SpO2 98%   BMI 29.29 kg/m  General: Well developed white female in no acute distress Head: Normocephalic and atraumatic Eyes:  Sclerae anicteric, conjunctiva pink. Ears: Normal auditory acuity Lungs: Clear throughout to auscultation; no W/R/R. Heart: Regular rate and rhythm; no M/R/G. Abdomen: Soft, non-distended.  BS  present.  Mild epigastric TTP. Musculoskeletal: Symmetrical with no gross deformities  Skin: No lesions on visible extremities Extremities: No edema  Neurological: Alert oriented x 4, grossly non-focal Psychological:  Alert and cooperative. Normal mood and affect  ASSESSMENT AND PLAN: *31 year old female with complaints of epigastric abdominal pain and reflux as well as nausea and generalized abdominal bloating.  She does take 3 ibuprofen about 2 or 3 times per week.  She is on pantoprazole 40 mg daily and that does help some of her symptoms, but does not help with the bloating and gassiness.  Has recurrent symptoms right away if she misses it, however.  We will plan for EGD with Dr. 26 to evaluate for esophagitis, ulcer, etc.  We will check labs today including CBC, CMP, sed rate, CRP, TSH, celiac labs, and iron levels as she has been taking iron for the past year or 2 since she was told that her iron levels are low.  So discussed a lactose-free diet and she was given literature on this.  She will try this for the next 2 to 3 weeks if it helps her bloating.  **The risks, benefits, and alternatives to EGD were discussed with the patient and she consents to proceed.    CC:  Orvan Falconer, NP

## 2022-06-07 NOTE — Patient Instructions (Addendum)
Your provider has requested that you go to the basement level for lab work before leaving today. Press "B" on the elevator. The lab is located at the first door on the left as you exit the elevator.  You have been scheduled for an endoscopy. Please follow written instructions given to you at your visit today. If you use inhalers (even only as needed), please bring them with you on the day of your procedure.    If you are age 30 or younger, your body mass index should be between 19-25. Your Body mass index is 29.29 kg/m. If this is out of the aformentioned range listed, please consider follow up with your Primary Care Provider.   ________________________________________________________  The [facility] providers would like to encourage you to use [facility] to communicate with providers for non-urgent requests or questions.  Due to long hold times on the telephone, sending your provider a message by [facility] may be a faster and more efficient way to get a response.  Please allow 48 business hours for a response.  Please remember that this is for non-urgent requests.  _______________________________________________________  Due to recent changes in healthcare laws, you may see the results of your imaging and laboratory studies on MyChart before your provider has had a chance to review them.  We understand that in some cases there may be results that are confusing or concerning to you. Not all laboratory results come back in the same time frame and the provider may be waiting for multiple results in order to interpret others.  Please give [place] 48 hours in order for your provider to thoroughly review all the results before contacting the office for clarification of your results.     Thank you for choosing me and [facility].  Alonza Bogus, PA-C

## 2022-06-08 LAB — TISSUE TRANSGLUTAMINASE, IGA: (tTG) Ab, IgA: <1 U/mL

## 2022-06-08 LAB — IGA: Immunoglobulin A: 122 mg/dL (ref 47–310)

## 2022-06-27 ENCOUNTER — Encounter: Payer: Self-pay | Admitting: Gastroenterology

## 2022-06-27 ENCOUNTER — Ambulatory Visit (AMBULATORY_SURGERY_CENTER): Payer: Medicaid Other | Admitting: Gastroenterology

## 2022-06-27 VITALS — BP 104/69 | HR 62 | Temp 97.3°F | Resp 13 | Ht 61.0 in | Wt 155.0 lb

## 2022-06-27 DIAGNOSIS — K2950 Unspecified chronic gastritis without bleeding: Secondary | ICD-10-CM | POA: Diagnosis not present

## 2022-06-27 DIAGNOSIS — K2289 Other specified disease of esophagus: Secondary | ICD-10-CM | POA: Diagnosis not present

## 2022-06-27 DIAGNOSIS — K2970 Gastritis, unspecified, without bleeding: Secondary | ICD-10-CM | POA: Diagnosis not present

## 2022-06-27 DIAGNOSIS — R1013 Epigastric pain: Secondary | ICD-10-CM

## 2022-06-27 DIAGNOSIS — R140 Abdominal distension (gaseous): Secondary | ICD-10-CM

## 2022-06-27 DIAGNOSIS — K219 Gastro-esophageal reflux disease without esophagitis: Secondary | ICD-10-CM | POA: Diagnosis not present

## 2022-06-27 DIAGNOSIS — R110 Nausea: Secondary | ICD-10-CM

## 2022-06-27 DIAGNOSIS — K449 Diaphragmatic hernia without obstruction or gangrene: Secondary | ICD-10-CM | POA: Diagnosis not present

## 2022-06-27 MED ORDER — SODIUM CHLORIDE 0.9 % IV SOLN: 500.0000 mL | Freq: Once | INTRAVENOUS | Status: DC

## 2022-06-27 NOTE — Progress Notes (Signed)
To pacu, VSS. Report to [email or web address] 

## 2022-06-27 NOTE — Progress Notes (Signed)
Indication for upper endoscopy: Nausea, bloating, abdominal pain  Please see the 06/07/2022 office visit for complete details.  There is been no significant change in history or physical exam since that time.  The patient remains an appropriate candidate for monitored anesthesia care in the endoscopy center.

## 2022-06-27 NOTE — Progress Notes (Signed)
Reviewed and agree with management plans. ? ?Eliyahu Bille L. Jaeli Grubb, MD, MPH  ?

## 2022-06-27 NOTE — Op Note (Signed)
[facility] Patient Name: Nancy [name] Procedure Date: 06/27/2022 3:00 PM MRN: [id number] Endoscopist: Tressia Danas MD, MD Age: 31 Referring MD:  Date of Birth: [birth year] Gender: Female Account #: [id number] Procedure:                Upper GI endoscopy Indications:              Abdominal pain, Abdominal bloating, Nausea Medicines:                Monitored Anesthesia Care Procedure:                Pre-Anesthesia Assessment:                           - Prior to the procedure, a History and Physical                            was performed, and patient medications and                            allergies were reviewed. The patient's tolerance of                            previous anesthesia was also reviewed. The risks                            and benefits of the procedure and the sedation                            options and risks were discussed with the patient.                            All questions were answered, and informed consent                            was obtained. Prior Anticoagulants: The patient has                            taken no previous anticoagulant or antiplatelet                            agents. ASA Grade Assessment: II - A patient with                            mild systemic disease. After reviewing the risks                            and benefits, the patient was deemed in                            satisfactory condition to undergo the procedure.                           After obtaining informed consent, the endoscope was  passed under direct vision. Throughout the                            procedure, the patient's blood pressure, pulse, and                            oxygen saturations were monitored continuously. The                            Endoscope was introduced through the mouth, and                            advanced to the third part of duodenum. The upper                            GI  endoscopy was accomplished without difficulty.                            The patient tolerated the procedure well. Scope In: Scope Out: Findings:                 The Z-line was irregular and was found 34 cm from                            the incisors. Biopsies were taken from the distal                            esophagus with a cold forceps for histology.                            Estimated blood loss was minimal.                           Localized mildly erythematous mucosa without                            bleeding was found in the gastric body. Biopsies                            were taken from the antrum, body, and fundus with a                            cold forceps for histology. Estimated blood loss                            was minimal.                           The ampulla is irregular. It is elongated, linear,                            and larger than expected. The overlying mucosa does  not have an adenomatous appearance. I did not                            obtain biopsies given concerns for stenosis.                           A hiatal hernia was present.                           The examined duodenum was normal. Biopsies were                            taken with a cold forceps for histology. Estimated                            blood loss was minimal. Complications:            No immediate complications. Estimated Blood Loss:     Estimated blood loss was minimal. Impression:               - Z-line irregular, 34 cm from the incisors.                            Biopsied.                           - Erythematous mucosa in the gastric body. Biopsied.                           - Hiatal hernia.                           - Irregular ampulla.                           - Normal examined duodenum. Biopsied. Recommendation:           - Patient has a contact number available for                            emergencies. The signs and symptoms of  potential                            delayed complications were discussed with the                            patient. Return to normal activities tomorrow.                            Written discharge instructions were provided to the                            patient.                           - Resume previous diet.                           -  Continue present medications.                           - Await pathology results.                           - I will review these results with our advanced                            endoscopist to determine the next best steps in                            evaluation of the ampulla. Tressia Danas MD, MD 06/27/2022 3:25:56 PM This report has been signed electronically.

## 2022-06-27 NOTE — Progress Notes (Signed)
Called to room to assist during endoscopic procedure.  Patient ID and intended procedure confirmed with present staff. Received instructions for my participation in the procedure from the performing physician.  

## 2022-06-27 NOTE — Progress Notes (Signed)
Pt's states no medical or surgical changes since previsit or office visit. VS assessed by D.T 

## 2022-06-27 NOTE — Patient Instructions (Signed)
YOU HAD AN ENDOSCOPIC PROCEDURE TODAY AT THE [facility]:   Refer to the procedure report that was given to you for any specific questions about what was found during the examination.  If the procedure report does not answer your questions, please call your gastroenterologist to clarify.  If you requested that your care partner not be given the details of your procedure findings, then the procedure report has been included in a sealed envelope for you to review at your convenience later.  YOU SHOULD EXPECT: Some feelings of bloating in the abdomen. Passage of more gas than usual.  Walking can help get rid of the air that was put into your GI tract during the procedure and reduce the bloating. If you had a lower endoscopy (such as a colonoscopy or flexible sigmoidoscopy) you may notice spotting of blood in your stool or on the toilet paper. If you underwent a bowel prep for your procedure, you may not have a normal bowel movement for a few days.  Please Note:  You might notice some irritation and congestion in your nose or some drainage.  This is from the oxygen used during your procedure.  There is no need for concern and it should clear up in a day or so.  SYMPTOMS TO REPORT IMMEDIATELY:   Following upper endoscopy (EGD)  Vomiting of blood or coffee ground material  New chest pain or pain under the shoulder blades  Painful or persistently difficult swallowing  New shortness of breath  Fever of 100F or higher  Black, tarry-looking stools  For urgent or emergent issues, a gastroenterologist can be reached at any hour by calling [phone]. Do not use MyChart messaging for urgent concerns.    DIET:  We do recommend a small meal at first, but then you may proceed to your regular diet.  Drink plenty of fluids but you should avoid alcoholic beverages for 24 hours.  ACTIVITY:  You should plan to take it easy for the rest of today and you should NOT DRIVE or use heavy machinery  until tomorrow (because of the sedation medicines used during the test).    FOLLOW UP: Our staff will call the number listed on your records the next business day following your procedure.  We will call around 7:15- 8:00 am to check on you and address any questions or concerns that you may have regarding the information given to you following your procedure. If we do not reach you, we will leave a message.     If any biopsies were taken you will be contacted by phone or by letter within the next 1-3 weeks.  Please call us at [phone] if you have not heard about the biopsies in 3 weeks.    SIGNATURES/CONFIDENTIALITY: You and/or your care partner have signed paperwork which will be entered into your electronic medical record.  These signatures attest to the fact that that the information above on your After Visit Summary has been reviewed and is understood.  Full responsibility of the confidentiality of this discharge information lies with you and/or your care-partner. 

## 2022-06-28 ENCOUNTER — Telehealth: Payer: Self-pay

## 2022-06-28 NOTE — Telephone Encounter (Signed)
Thornton Park, MD  Carl Best, RN  The patient know that I would review her EGD findings at the ampulla with our advanced endoscopist. Please let her know that his suspicion is this is a normal variant. But, that a CT scan with IV/oral contrast would be the next step. Please arrange for the CT and an office follow-up with me after that. Thanks.   KLB

## 2022-06-28 NOTE — Telephone Encounter (Signed)
Left message for patient to call back  

## 2022-06-28 NOTE — Telephone Encounter (Signed)
Post procedure follow up call, no answer 

## 2022-06-29 ENCOUNTER — Other Ambulatory Visit: Payer: Self-pay

## 2022-06-29 NOTE — Telephone Encounter (Signed)
Left message for patient to call back  

## 2022-06-29 NOTE — Telephone Encounter (Signed)
Spoke with patient regarding MD recommendations. She will proceed with CT as recommended. Follow up scheduled for 08/17/22 at 10:10 am with Dr. Tarri Glenn.

## 2022-06-29 NOTE — Telephone Encounter (Signed)
Patient is returning your call.  

## 2022-06-30 ENCOUNTER — Other Ambulatory Visit: Payer: Self-pay

## 2022-06-30 DIAGNOSIS — R933 Abnormal findings on diagnostic imaging of other parts of digestive tract: Secondary | ICD-10-CM

## 2022-06-30 NOTE — Telephone Encounter (Signed)
CT ordered & sent to schedulers. Number was also provided to patient if she has not heard from them within 1 week.

## 2022-07-12 ENCOUNTER — Ambulatory Visit (HOSPITAL_COMMUNITY)
Admission: RE | Admit: 2022-07-12 | Discharge: 2022-07-12 | Disposition: A | Discharge status: Home / Self Care | Payer: Medicaid Other | Source: Ambulatory Visit | Attending: Gastroenterology | Admitting: Gastroenterology

## 2022-07-12 DIAGNOSIS — R933 Abnormal findings on diagnostic imaging of other parts of digestive tract: Secondary | ICD-10-CM | POA: Insufficient documentation

## 2022-07-12 MED ORDER — IOHEXOL 350 MG/ML SOLN
65.0000 mL | Freq: Once | INTRAVENOUS | Status: AC | PRN
  Administered 2022-07-12: 65 mL via INTRAVENOUS

## 2022-07-18 ENCOUNTER — Other Ambulatory Visit: Payer: Self-pay

## 2022-08-17 ENCOUNTER — Ambulatory Visit: Payer: Medicaid Other | Admitting: Gastroenterology

## 2022-12-14 LAB — LAB REPORT - SCANNED: EGFR (Non-African Amer.): 110

## 2023-02-17 ENCOUNTER — Ambulatory Visit
Admission: EM | Admit: 2023-02-17 | Discharge: 2023-02-17 | Disposition: A | Discharge status: Home / Self Care | Payer: Medicaid Other | Attending: Internal Medicine | Admitting: Internal Medicine

## 2023-02-17 DIAGNOSIS — J029 Acute pharyngitis, unspecified: Secondary | ICD-10-CM

## 2023-02-17 DIAGNOSIS — Z1152 Encounter for screening for COVID-19: Secondary | ICD-10-CM | POA: Insufficient documentation

## 2023-02-17 DIAGNOSIS — H9202 Otalgia, left ear: Secondary | ICD-10-CM | POA: Diagnosis not present

## 2023-02-17 DIAGNOSIS — B9789 Other viral agents as the cause of diseases classified elsewhere: Secondary | ICD-10-CM | POA: Diagnosis not present

## 2023-02-17 DIAGNOSIS — J069 Acute upper respiratory infection, unspecified: Secondary | ICD-10-CM | POA: Insufficient documentation

## 2023-02-17 LAB — POCT RAPID STREP A (OFFICE): Rapid Strep A Screen: NEGATIVE

## 2023-02-17 MED ORDER — FLUTICASONE PROPIONATE 50 MCG/ACT NA SUSP: 1.0000 | Freq: Every day | NASAL | 0 refills | Status: AC

## 2023-02-17 NOTE — ED Triage Notes (Signed)
Pt reports fatigue, sore throat and left era pain x 2 days. Ibuprofen gives some relief.

## 2023-02-17 NOTE — Discharge Instructions (Signed)
Rapid strep test was negative.  Throat culture and COVID test is pending.  Will call if it is abnormal.  As we discussed, it appears that you have a viral illness that should run its course.  I have prescribed you a nasal spray which should help alleviate symptoms and ear pain.  Follow-up if any symptoms persist or worsen.

## 2023-02-17 NOTE — ED Provider Notes (Signed)
[facility]    CSN: [id number] Arrival date & time: 02/17/23  0801      History   Chief Complaint Chief Complaint  Patient presents with   Sore Throat   Otalgia         HPI Nancy [name] is a 32 y.o. female.   Patient presents with sore throat, left ear pain, runny nose, fatigue that started about 2 days ago.  Patient denies any coughing.  Tmax at home was 100 per patient report.  Patient reports that her children's father has "not been feeling well".  Denies chest pain, shortness of breath, gastrointestinal symptoms.  Patient has taken ibuprofen for symptoms.   Sore Throat  Otalgia   Past Medical History:  Diagnosis Date   Colitis    GERD (gastroesophageal reflux disease)    Medical history non-contributory     Patient Active Problem List   Diagnosis Date Noted   Bloating 06/07/2022   Gastroesophageal reflux disease 06/07/2022   Abdominal pain, epigastric 06/07/2022   Nausea without vomiting 06/07/2022   Low back pain 01/13/2016    Past Surgical History:  Procedure Laterality Date   CESAREAN SECTION     CESAREAN SECTION N/A 11/11/2013   Procedure: CESAREAN SECTION With Bilateral Tubal Ligation;  Surgeon: Mitchel Honour, DO;  Location: [facility];  Service: Obstetrics;  Laterality: N/A;    OB History     Gravida  2   Para  2   Term  1   Preterm  1   AB  0   Living  3      SAB  0   IAB  0   Ectopic  0   Multiple  1   Live Births  3            Home Medications    Prior to Admission medications   Medication Sig Start Date End Date Taking? Authorizing Provider  fluticasone (FLONASE) 50 MCG/ACT nasal spray Place 1 spray into both nostrils daily. 02/17/23  Yes Anaira Seay, Rolly Salter E, FNP  Ferrous Sulfate (IRON) 325 (65 Fe) MG TABS Take by mouth.    [provider]  ibuprofen (ADVIL,MOTRIN) 600 MG tablet Take 1 tablet (600 mg total) by mouth every 6 (six) hours as needed. 09/19/18   Jacalyn Lefevre, MD  Multiple  Vitamins-Minerals (MULTIVITAMIN WITH MINERALS) tablet Take 1 tablet by mouth daily.    [provider]  pantoprazole (PROTONIX) 20 MG tablet Take 1 tablet by mouth daily.    [provider]    Family History Family History  Problem Relation Age of Onset   Hypertension Mother    Colon cancer Maternal Grandmother    Breast cancer Maternal Grandmother    Breast cancer Paternal Grandmother    Rectal cancer Neg Hx    Esophageal cancer Neg Hx     Social History Social History   Tobacco Use   Smoking status: Never   Smokeless tobacco: Never  Vaping Use   Vaping Use: Never used  Substance Use Topics   Alcohol use: Yes   Drug use: No     Allergies   Flagyl [metronidazole] and Other   Review of Systems Review of Systems Per HPI  Physical Exam Triage Vital Signs ED Triage Vitals  Enc Vitals Group     BP 02/17/23 0825 102/65     Pulse Rate 02/17/23 0825 83     Resp 02/17/23 0825 16     Temp 02/17/23 0825 98.2 F (36.8 C)  Temp Source 02/17/23 0825 Oral     SpO2 02/17/23 0825 98 %     Weight --      Height --      Head Circumference --      Peak Flow --      Pain Score 02/17/23 0824 8     Pain Loc --      Pain Edu? --      Excl. in GC? --    No data found.  Updated Vital Signs BP 102/65 (BP Location: Left Arm)   Pulse 83   Temp 98.2 F (36.8 C) (Oral)   Resp 16   LMP 01/28/2023 (Exact Date)   SpO2 98%   Visual Acuity Right Eye Distance:   Left Eye Distance:   Bilateral Distance:    Right Eye Near:   Left Eye Near:    Bilateral Near:     Physical Exam Constitutional:      General: She is not in acute distress.    Appearance: Normal appearance. She is not toxic-appearing or diaphoretic.  HENT:     Head: Normocephalic and atraumatic.     Right Ear: Ear canal normal. No drainage, swelling or tenderness. A middle ear effusion is present. Tympanic membrane is not perforated, erythematous or bulging.     Left Ear: Ear canal normal.  No drainage, swelling or tenderness. A middle ear effusion is present. Tympanic membrane is not perforated, erythematous or bulging.     Nose: Congestion present.     Mouth/Throat:     Mouth: Mucous membranes are moist.     Pharynx: Posterior oropharyngeal erythema present.  Eyes:     Extraocular Movements: Extraocular movements intact.     Conjunctiva/sclera: Conjunctivae normal.     Pupils: Pupils are equal, round, and reactive to light.  Cardiovascular:     Rate and Rhythm: Normal rate and regular rhythm.     Pulses: Normal pulses.     Heart sounds: Normal heart sounds.  Pulmonary:     Effort: Pulmonary effort is normal. No respiratory distress.     Breath sounds: Normal breath sounds. No wheezing.  Abdominal:     General: Abdomen is flat. Bowel sounds are normal.     Palpations: Abdomen is soft.  Musculoskeletal:        General: Normal range of motion.     Cervical back: Normal range of motion.  Skin:    General: Skin is warm and dry.  Neurological:     General: No focal deficit present.     Mental Status: She is alert and oriented to person, place, and time. Mental status is at baseline.  Psychiatric:        Mood and Affect: Mood normal.        Behavior: Behavior normal.      UC Treatments / Results  Labs (all labs ordered are listed, but only abnormal results are displayed) Labs Reviewed  CULTURE, GROUP A STREP ([facility])  SARS CORONAVIRUS 2 (TAT 6-24 HRS)  POCT RAPID STREP A (OFFICE)    EKG   Radiology No results found.  Procedures Procedures (including critical care time)  Medications Ordered in UC Medications - No data to display  Initial Impression / Assessment and Plan / UC Course  I have reviewed the triage vital signs and the nursing notes.  Pertinent labs & imaging results that were available during my care of the patient were reviewed by me and considered in my medical decision making (see chart for  details).     Patient presents with symptoms  likely from a viral upper respiratory infection.  Do not suspect underlying cardiopulmonary process. Symptoms seem unlikely related to ACS, CHF or COPD exacerbations, pneumonia, pneumothorax. Patient is nontoxic appearing and not in need of emergent medical intervention. Rapid step is negative.  Throat culture and COVID test pending.  Recommended symptom control with over the counter medications.  Return if symptoms fail to improve in 1-2 weeks or you develop shortness of breath, chest pain, severe headache. Patient states understanding and is agreeable.  Discharged with PCP followup.  Final Clinical Impressions(s) / UC Diagnoses   Final diagnoses:  Viral upper respiratory infection  Sore throat     Discharge Instructions      Rapid strep test was negative.  Throat culture and COVID test is pending.  Will call if it is abnormal.  As we discussed, it appears that you have a viral illness that should run its course.  I have prescribed you a nasal spray which should help alleviate symptoms and ear pain.  Follow-up if any symptoms persist or worsen.    ED Prescriptions     Medication Sig Dispense Auth. Provider   fluticasone (FLONASE) 50 MCG/ACT nasal spray Place 1 spray into both nostrils daily. 16 g Gustavus Bryant, [place]      PDMP not reviewed this encounter.   Gustavus Bryant, [place] 02/17/23 [phone]

## 2023-02-18 LAB — SARS CORONAVIRUS 2 (TAT 6-24 HRS): SARS Coronavirus 2: NEGATIVE

## 2023-02-20 LAB — CULTURE, GROUP A STREP (THRC)

## 2023-02-28 ENCOUNTER — Ambulatory Visit (INDEPENDENT_AMBULATORY_CARE_PROVIDER_SITE_OTHER): Payer: Medicaid Other | Admitting: Gastroenterology

## 2023-02-28 ENCOUNTER — Encounter: Payer: Self-pay | Admitting: Gastroenterology

## 2023-02-28 VITALS — BP 94/68 | HR 82 | Ht 61.0 in | Wt 153.0 lb

## 2023-02-28 DIAGNOSIS — R140 Abdominal distension (gaseous): Secondary | ICD-10-CM

## 2023-02-28 DIAGNOSIS — R194 Change in bowel habit: Secondary | ICD-10-CM | POA: Insufficient documentation

## 2023-02-28 NOTE — Patient Instructions (Signed)
Follow Low FODMap Diet.   Contact us if you want to schedule colonoscopy.   Follow up as needed.  _______________________________________________________  If your blood pressure at your visit was 140/90 or greater, please contact your primary care physician to follow up on this.  _______________________________________________________  If you are age 31 or older, your body mass index should be between 23-30. Your Body mass index is 28.91 kg/m. If this is out of the aforementioned range listed, please consider follow up with your Primary Care Provider.  If you are age 75 or younger, your body mass index should be between 19-25. Your Body mass index is 28.91 kg/m. If this is out of the aformentioned range listed, please consider follow up with your Primary Care Provider.   ________________________________________________________  The [facility] providers would like to encourage you to use [facility] to communicate with providers for non-urgent requests or questions.  Due to long hold times on the telephone, sending your provider a message by [facility] may be a faster and more efficient way to get a response.  Please allow 48 business hours for a response.  Please remember that this is for non-urgent requests.  _______________________________________________________

## 2023-02-28 NOTE — Progress Notes (Signed)
02/28/2023 Nancy [name] [id number] [birth year]   HISTORY OF PRESENT ILLNESS:  This is a 32 year old female who was previously a patient of Dr. Orvan Falconer.  Her care will be assumed by Dr. Chales Abrahams in Dr. Orvan Falconer absence.  She presents here today with complaints of generalized abdominal bloating and irregular bowel movements.  She says that she feels like she is bloated all the time.  She says that she only has a solid stool maybe about once a week.  She says that sometimes she will not have a bowel movement for 2 or 3 days at a time and then will have 2-3 watery bowel movements in a day.  She does not see any blood in her stool.  She wakes up rarely on occasion at nighttime due to GI issues, but not often.  She says she did try lactose-free diet and that seemed to help.  This has been going on for quite some time.  She had an episode of infectious colitis back in 2018, but at that point she was having a lot of rectal bleeding and that resolved within a couple of days with no recurrence of the symptoms since then.  CT scan last year did not show any evidence of colitis.  All lab work-up last year was normal/negative.  Regards to her reflux symptoms pantoprazole 40 mg daily is working well for her.   Past Medical History:  Diagnosis Date   Colitis    infectious colitis, 2018   GERD (gastroesophageal reflux disease)    Medical history non-contributory    Past Surgical History:  Procedure Laterality Date   CESAREAN SECTION     CESAREAN SECTION N/A 11/11/2013   Procedure: CESAREAN SECTION With Bilateral Tubal Ligation;  Surgeon: Mitchel Honour, DO;  Location: [facility];  Service: Obstetrics;  Laterality: N/A;    reports that she has never smoked. She has never used smokeless tobacco. She reports current alcohol use. She reports that she does not use drugs. family history includes Breast cancer in her maternal grandmother and paternal grandmother; Colon cancer in her maternal grandmother;  Hypertension in her mother. Allergies  Allergen Reactions   Flagyl [Metronidazole] Other (See Comments)    Per pt felt shaking, headache, nausea.   Other Other (See Comments)    Pt stated that she was nausea, vomiting, headache when she was prescribed cholinergic meds (not remember exactly) in the past.       Outpatient Encounter Medications as of 02/28/2023  Medication Sig   clindamycin (CLEOCIN) 300 MG capsule Take 300 mg by mouth 2 (two) times daily.   fluticasone (FLONASE) 50 MCG/ACT nasal spray Place 1 spray into both nostrils daily.   ibuprofen (ADVIL,MOTRIN) 600 MG tablet Take 1 tablet (600 mg total) by mouth every 6 (six) hours as needed.   Multiple Vitamins-Minerals (MULTIVITAMIN WITH MINERALS) tablet Take 1 tablet by mouth daily.   pantoprazole (PROTONIX) 20 MG tablet Take 40 mg by mouth daily.   [DISCONTINUED] Ferrous Sulfate (IRON) 325 (65 Fe) MG TABS Take by mouth.   No facility-administered encounter medications on file as of 02/28/2023.    REVIEW OF SYSTEMS  : All other systems reviewed and negative except where noted in the History of Present Illness.   PHYSICAL EXAM: BP 94/68   Pulse 82   Ht 5\' 1"  (1.549 m)   Wt 153 lb (69.4 kg)   LMP 02/28/2023 (Exact Date)   BMI 28.91 kg/m  General: Well developed white female in no acute  distress Head: Normocephalic and atraumatic Eyes:  Sclerae anicteric, conjunctiva pink. Ears: Normal auditory acuity Lungs: Clear throughout to auscultation; no W/R/R. Heart: Regular rate and rhythm; no M/R/G. Abdomen: Soft, non-distended.  BS present.  Non-tender. Musculoskeletal: Symmetrical with no gross deformities  Skin: No lesions on visible extremities Extremities: No edema  Neurological: Alert oriented x 4, grossly non-focal Psychological:  Alert and cooperative. Normal mood and affect  ASSESSMENT AND PLAN: *32 year old female with complaints of generalized abdominal bloating and irregular bowel movements.  This has been going  on for quite some time.  She tried lactose-free diet and that does help to some degree.  She had an episode of infectious colitis back in 2018, but at that point she was having a lot of rectal bleeding and that resolved within a couple of days with no recurrence of the symptoms since then.  CT scan last year did not show any evidence of colitis.  This could be component of IBS, could be dietary.  We discussed colonoscopy.  She would like to hold off on that at this time.  We also discussed the FODMAP diet in detail and she was given literature on this.  She would like to try this for a while before proceeding with colonoscopy.  All lab work-up last year was normal/negative. *GERD: Much improved on pantoprazole 40 mg daily.  Continue this for now.   CC:  [facility]ractice, Pleasant Delene Ruffini*

## 2024-02-03 ENCOUNTER — Encounter: Payer: Self-pay | Admitting: Emergency Medicine

## 2024-02-03 ENCOUNTER — Ambulatory Visit
Admission: EM | Admit: 2024-02-03 | Discharge: 2024-02-03 | Disposition: A | Discharge status: Home / Self Care | Attending: Internal Medicine | Admitting: Internal Medicine

## 2024-02-03 DIAGNOSIS — H6123 Impacted cerumen, bilateral: Secondary | ICD-10-CM | POA: Diagnosis not present

## 2024-02-03 DIAGNOSIS — H66002 Acute suppurative otitis media without spontaneous rupture of ear drum, left ear: Secondary | ICD-10-CM

## 2024-02-03 MED ORDER — AMOXICILLIN 875 MG PO TABS: 875.0000 mg | ORAL_TABLET | Freq: Two times a day (BID) | ORAL | 0 refills | Status: AC

## 2024-02-03 MED ORDER — FLUCONAZOLE 150 MG PO TABS: 150.0000 mg | ORAL_TABLET | Freq: Every day | ORAL | 0 refills | Status: AC

## 2024-02-03 NOTE — ED Provider Notes (Signed)
 [facility]    CSN: [id number] Arrival date & time: 02/03/24  1525      History   Chief Complaint Chief Complaint  Patient presents with   Ear Fullness    HPI Nancy [name] is a 33 y.o. female.   33 year old female who presents urgent care with complaints of left ear pain.  She reports that for the last 3 days she has had some fullness in both ears but the left ear began to hurt yesterday.  She denies any fevers, chills, cough, congestion, chest pain, shortness of breath or sore throat.  She does have a little bit of pain along the left side of her neck along with the ear.  She does not routinely wear ear pods.   Ear Fullness Pertinent negatives include no chest pain, no abdominal pain and no shortness of breath.    Past Medical History:  Diagnosis Date   Colitis    infectious colitis, 2018   GERD (gastroesophageal reflux disease)    Medical history non-contributory     Patient Active Problem List   Diagnosis Date Noted   Altered bowel habits 02/28/2023   Bloating 06/07/2022   Gastroesophageal reflux disease 06/07/2022   Abdominal pain, epigastric 06/07/2022   Nausea without vomiting 06/07/2022   Low back pain 01/13/2016    Past Surgical History:  Procedure Laterality Date   CESAREAN SECTION     CESAREAN SECTION N/A 11/11/2013   Procedure: CESAREAN SECTION With Bilateral Tubal Ligation;  Surgeon: Dyanna Glasgow, DO;  Location: [facility];  Service: Obstetrics;  Laterality: N/A;    OB History     Gravida  2   Para  2   Term  1   Preterm  1   AB  0   Living  3      SAB  0   IAB  0   Ectopic  0   Multiple  1   Live Births  3            Home Medications    Prior to Admission medications   Medication Sig Start Date End Date Taking? Authorizing Provider  amoxicillin  (AMOXIL ) 875 MG tablet Take 1 tablet (875 mg total) by mouth 2 (two) times daily for 10 days. 02/03/24 02/13/24 Yes Aarit Kashuba A, PA-C  fluconazole  (DIFLUCAN) 150 MG tablet Take 1 tablet (150 mg total) by mouth daily for 2 days. take 1 tablet at first signs of a yeast infection and then repeat in 3 days 02/03/24 02/05/24 Yes Maleko Greulich A, PA-C  clindamycin  (CLEOCIN ) 300 MG capsule Take 300 mg by mouth 2 (two) times daily. 02/23/23   [provider]  fluticasone  (FLONASE ) 50 MCG/ACT nasal spray Place 1 spray into both nostrils daily. 02/17/23   Dodson Freestone, FNP  ibuprofen  (ADVIL ,MOTRIN ) 600 MG tablet Take 1 tablet (600 mg total) by mouth every 6 (six) hours as needed. 09/19/18   Sueellen Emery, MD  Multiple Vitamins-Minerals (MULTIVITAMIN WITH MINERALS) tablet Take 1 tablet by mouth daily.    [provider]  pantoprazole  (PROTONIX ) 20 MG tablet Take 40 mg by mouth daily.    [provider]    Family History Family History  Problem Relation Age of Onset   Hypertension Mother    Colon cancer Maternal Grandmother    Breast cancer Maternal Grandmother    Breast cancer Paternal Grandmother    Rectal cancer Neg Hx    Esophageal cancer Neg Hx     Social  History Social History   Tobacco Use   Smoking status: Never    Passive exposure: Never   Smokeless tobacco: Never  Vaping Use   Vaping status: Never Used  Substance Use Topics   Alcohol use: Yes   Drug use: No     Allergies   Flagyl  [metronidazole ] and Other   Review of Systems Review of Systems  Constitutional:  Negative for chills and fever.  HENT:  Positive for ear pain (Left) and hearing loss. Negative for sore throat.   Eyes:  Negative for pain and visual disturbance.  Respiratory:  Negative for cough and shortness of breath.   Cardiovascular:  Negative for chest pain and palpitations.  Gastrointestinal:  Negative for abdominal pain and vomiting.  Genitourinary:  Negative for dysuria and hematuria.  Musculoskeletal:  Negative for arthralgias and back pain.  Skin:  Negative for color change and rash.  Neurological:  Negative for  seizures and syncope.  All other systems reviewed and are negative.    Physical Exam Triage Vital Signs ED Triage Vitals  Encounter Vitals Group     BP 02/03/24 1551 108/75     Systolic BP Percentile --      Diastolic BP Percentile --      Pulse Rate 02/03/24 1551 69     Resp 02/03/24 1551 16     Temp 02/03/24 1551 98.7 F (37.1 C)     Temp Source 02/03/24 1551 Oral     SpO2 02/03/24 1551 96 %     Weight 02/03/24 1551 153 lb (69.4 kg)     Height --      Head Circumference --      Peak Flow --      Pain Score 02/03/24 1549 8     Pain Loc --      Pain Education --      Exclude from Growth Chart --    No data found.  Updated Vital Signs BP 108/75 (BP Location: Left Arm)   Pulse 69   Temp 98.7 F (37.1 C) (Oral)   Resp 16   Wt 153 lb (69.4 kg)   LMP 01/27/2024 (Approximate)   SpO2 96%   Breastfeeding No   BMI 28.91 kg/m   Visual Acuity Right Eye Distance:   Left Eye Distance:   Bilateral Distance:    Right Eye Near:   Left Eye Near:    Bilateral Near:     Physical Exam Vitals and nursing note reviewed.  Constitutional:      General: She is not in acute distress.    Appearance: She is well-developed.  HENT:     Head: Normocephalic and atraumatic.     Right Ear: There is impacted cerumen.     Left Ear: There is impacted cerumen.     Ears:     Comments: Right tympanic membrane is clear, the left has thick cloudy fluid behind the tympanic membrane consistent with an otitis media Eyes:     Conjunctiva/sclera: Conjunctivae normal.  Cardiovascular:     Rate and Rhythm: Normal rate and regular rhythm.     Heart sounds: No murmur heard. Pulmonary:     Effort: Pulmonary effort is normal. No respiratory distress.     Breath sounds: Normal breath sounds.  Abdominal:     Palpations: Abdomen is soft.     Tenderness: There is no abdominal tenderness.  Musculoskeletal:        General: No swelling.     Cervical back: Neck supple.  Skin:    General: Skin is warm  and dry.     Capillary Refill: Capillary refill takes less than 2 seconds.  Neurological:     Mental Status: She is alert.  Psychiatric:        Mood and Affect: Mood normal.      UC Treatments / Results  Labs (all labs ordered are listed, but only abnormal results are displayed) Labs Reviewed - No data to display  EKG   Radiology No results found.  Procedures Procedures (including critical care time)  Medications Ordered in UC Medications - No data to display  Initial Impression / Assessment and Plan / UC Course  I have reviewed the triage vital signs and the nursing notes.  Pertinent labs & imaging results that were available during my care of the patient were reviewed by me and considered in my medical decision making (see chart for details).     Bilateral impacted cerumen  Non-recurrent acute suppurative otitis media of left ear without spontaneous rupture of tympanic membrane   Bilateral impacted cerumen.  Ear irrigation done today bilateral and tympanic membrane is clear on the right but the left does appear to have cloudy fluid consistent with otitis media. Will treat with the following:  Amoxicillin  875 mg twice daily for 10 days. Take it food Diflucan 150 mg take 1 tablet at first signs of a yeast infection and then repeat in 3 days.  Can try over-the-counter Debrox to help with earwax buildup. Do not use Q-tips in the ear canal as this can force wax deeper Return to urgent care or PCP if symptoms worsen or fail to resolve.    Final Clinical Impressions(s) / UC Diagnoses   Final diagnoses:  Bilateral impacted cerumen  Non-recurrent acute suppurative otitis media of left ear without spontaneous rupture of tympanic membrane     Discharge Instructions      Bilateral impacted cerumen (earwax).  Ear irrigation done today bilateral and tympanic membrane (ear drums) is clear on the right but the left does appear to have cloudy fluid consistent with otitis  media (middle ear infection). Will treat with the following:  Amoxicillin  875 mg twice daily for 10 days. Take it food Diflucan 150 mg take 1 tablet at first signs of a yeast infection and then repeat in 3 days.  Can try over-the-counter Debrox to help with earwax buildup. Do not use Q-tips in the ear canal as this can force wax deeper Return to urgent care or PCP if symptoms worsen or fail to resolve.     ED Prescriptions     Medication Sig Dispense Auth. Provider   amoxicillin  (AMOXIL ) 875 MG tablet Take 1 tablet (875 mg total) by mouth 2 (two) times daily for 10 days. 20 tablet Mariselda Badalamenti A, PA-C   fluconazole (DIFLUCAN) 150 MG tablet Take 1 tablet (150 mg total) by mouth daily for 2 days. take 1 tablet at first signs of a yeast infection and then repeat in 3 days 2 tablet Kreg Pesa, PA-C      PDMP not reviewed this encounter.   Kreg Pesa, PA-C 02/03/24 1639

## 2024-02-03 NOTE — ED Triage Notes (Addendum)
 Pt c/o ear fullness x 3 days. Pt says yesterday the pain started.

## 2024-02-03 NOTE — Discharge Instructions (Addendum)
 Bilateral impacted cerumen (earwax).  Ear irrigation done today bilateral and tympanic membrane (ear drums) is clear on the right but the left does appear to have cloudy fluid consistent with otitis media (middle ear infection). Will treat with the following:  Amoxicillin  875 mg twice daily for 10 days. Take it food Diflucan 150 mg take 1 tablet at first signs of a yeast infection and then repeat in 3 days.  Can try over-the-counter Debrox to help with earwax buildup. Do not use Q-tips in the ear canal as this can force wax deeper Return to urgent care or PCP if symptoms worsen or fail to resolve.
